# Patient Record
Sex: Female | Born: 1940 | Race: White | Hispanic: No | Smoking: Never smoker
Health system: Southern US, Community
[De-identification: ages and names within clinical notes are randomized; demographics above are authoritative.]

## PROBLEM LIST (undated history)

## (undated) ENCOUNTER — Emergency Department: Admission: EM | Payer: Medicare HMO

## (undated) DIAGNOSIS — H409 Unspecified glaucoma: Secondary | ICD-10-CM

## (undated) DIAGNOSIS — I6529 Occlusion and stenosis of unspecified carotid artery: Secondary | ICD-10-CM

## (undated) DIAGNOSIS — I10 Essential (primary) hypertension: Secondary | ICD-10-CM

## (undated) DIAGNOSIS — I2510 Atherosclerotic heart disease of native coronary artery without angina pectoris: Secondary | ICD-10-CM

## (undated) DIAGNOSIS — E785 Hyperlipidemia, unspecified: Secondary | ICD-10-CM

## (undated) DIAGNOSIS — C801 Malignant (primary) neoplasm, unspecified: Secondary | ICD-10-CM

## (undated) HISTORY — DX: Malignant (primary) neoplasm, unspecified: C80.1

## (undated) HISTORY — PX: EYE SURGERY: SHX253

## (undated) HISTORY — DX: Unspecified glaucoma: H40.9

## (undated) HISTORY — DX: Hyperlipidemia, unspecified: E78.5

## (undated) HISTORY — PX: CATARACT EXTRACTION, BILATERAL: SHX1313

## (undated) HISTORY — DX: Atherosclerotic heart disease of native coronary artery without angina pectoris: I25.10

## (undated) HISTORY — DX: Occlusion and stenosis of unspecified carotid artery: I65.29

## (undated) HISTORY — PX: SKIN CANCER EXCISION: SHX779

## (undated) HISTORY — DX: Essential (primary) hypertension: I10

---

## 2003-07-10 HISTORY — PX: CAROTID ENDARTERECTOMY: SUR193

## 2003-07-10 HISTORY — PX: CORONARY ARTERY BYPASS GRAFT: SHX141

## 2004-01-11 ENCOUNTER — Inpatient Hospital Stay (HOSPITAL_COMMUNITY): Admission: RE | Admit: 2004-01-11 | Discharge: 2004-01-15 | Payer: Self-pay | Admitting: Cardiothoracic Surgery

## 2004-03-02 ENCOUNTER — Encounter: Admission: RE | Admit: 2004-03-02 | Discharge: 2004-03-02 | Payer: Self-pay | Admitting: Cardiothoracic Surgery

## 2005-03-07 ENCOUNTER — Ambulatory Visit: Payer: Self-pay | Admitting: Ophthalmology

## 2005-04-11 ENCOUNTER — Ambulatory Visit: Payer: Self-pay | Admitting: Ophthalmology

## 2006-05-03 ENCOUNTER — Ambulatory Visit: Payer: Self-pay | Admitting: Unknown Physician Specialty

## 2007-12-26 ENCOUNTER — Ambulatory Visit: Payer: Self-pay | Admitting: Gastroenterology

## 2010-09-05 ENCOUNTER — Encounter (INDEPENDENT_AMBULATORY_CARE_PROVIDER_SITE_OTHER): Payer: BC Managed Care – PPO | Admitting: Vascular Surgery

## 2010-09-05 DIAGNOSIS — I6529 Occlusion and stenosis of unspecified carotid artery: Secondary | ICD-10-CM

## 2010-09-06 ENCOUNTER — Encounter: Payer: Self-pay | Admitting: Vascular Surgery

## 2010-09-06 NOTE — Consult Note (Signed)
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  She is very pleasant 70 year old white female who has undergone a prior her right carotid endarterectomy in conjunction with coronary artery bypass grafting at [facility] in 2005.  She has had no neurologic deficits.  She remains quite active, continues to work as a Midwife.  She has undergone carotid ultrasound at [facility] and I have the results for review, discussed with the patient.  She does not have any neurologic deficits.  MEDICAL HISTORY:  Positive for hypertension, elevated cholesterol.  SURGICAL HISTORY:  For coronary artery bypass grafting, carotid endarterectomy 2005.  SOCIAL HISTORY:  She is widowed with 2 children.  She does not smoke or drink alcohol.  FAMILY HISTORY:  Notable for premature arthrosclerotic disease.  REVIEW OF SYSTEMS:  Positive for weight loss.  She weighs 118 pounds, she is 5 feet 7 inches tall.  Review of systems is negative aside from HPI.  She is on aspirin therapy.  PHYSICAL EXAMINATION:  General:  Well-developed, thin white female appearing stated age of 70 in no acute distress.  Vital signs:  Blood pressure is 151/74 in her right arm, 161/73 in her left arm, heart rate 61, respirations 18.  HEENT:  Normal.  She has well-healed right carotid incision with no bruits bilaterally.  Chest:  Clear bilaterally.  Heart: Regular rate and rhythm.  Radial pulses are 2+.  She has 2+ dorsalis pedis pulses bilaterally.  Musculoskeletal:  Shows no major deformity or cyanosis.  Neurologic:  No focal weakness, paresthesias.  Skin:  Without ulcers or rashes.  I do have her carotid duplex from [facility] for review.  This shows no significant stenosis bilaterally from a velocity criteria.   The interpretation reports the appearance of stenosis in the visual portion of the duplex.  I had a long discussion with the patient and her family present.  I am quite pleased that she has not had any evidence of recurrent stenosis in her left carotid from her surgery 70 years ago.  She has no evidence of right carotid stenosis from a flow standpoint.  I explained that in our lab we do not rely on the visual images since this frequently over-calls any degree of stenosis.  I have recommended that we see her for continued followup in 6 months in our lab to rule out any progression of her stenosis.  She will notify us should she develop any difficulty in the interim.    Larina Earthly, M.D. Electronically Signed  TFE/MEDQ  D:  09/05/2010  T:  09/06/2010  Job:  [id number]  cc:   Dr. Gwen Pounds in [facility] Dr. Barbette Reichmann, [facility]

## 2011-09-03 ENCOUNTER — Encounter: Payer: Self-pay | Admitting: Vascular Surgery

## 2011-09-03 ENCOUNTER — Encounter: Payer: Self-pay | Admitting: *Deleted

## 2011-09-04 ENCOUNTER — Other Ambulatory Visit (INDEPENDENT_AMBULATORY_CARE_PROVIDER_SITE_OTHER): Payer: BC Managed Care – PPO | Admitting: *Deleted

## 2011-09-04 ENCOUNTER — Ambulatory Visit (INDEPENDENT_AMBULATORY_CARE_PROVIDER_SITE_OTHER): Payer: BC Managed Care – PPO | Admitting: Vascular Surgery

## 2011-09-04 ENCOUNTER — Encounter: Payer: Self-pay | Admitting: Vascular Surgery

## 2011-09-04 DIAGNOSIS — I6529 Occlusion and stenosis of unspecified carotid artery: Secondary | ICD-10-CM

## 2011-09-04 NOTE — Progress Notes (Signed)
The patient presents today for followup of extracranial cerebrovascular occlusive disease. He is status post right carotid endarterectomy with combined coronary bypass grafting in July 2005. She denies any new neurologic deficits is been stable from a cardiac standpoint.  Past Medical History  Diagnosis Date  . Hyperlipidemia   . Hypertension     History  Substance Use Topics  . Smoking status: Not on file  . Smokeless tobacco: Not on file  . Alcohol Use:     Family History  Problem Relation Age of Onset  . Other Other     premature atherosclerotic disease    Allergies  Allergen Reactions  . Penicillins     Current outpatient prescriptions:aspirin 325 MG EC tablet, Take 325 mg by mouth daily., Disp: , Rfl: ;  metoprolol tartrate (LOPRESSOR) 25 MG tablet, Take 25 mg by mouth daily., Disp: , Rfl: ;  Probiotic Product (ALIGN PO), Take by mouth., Disp: , Rfl: ;  rosuvastatin (CRESTOR) 5 MG tablet, Take 5 mg by mouth daily., Disp: , Rfl:   There were no vitals taken for this visit.  There is no height or weight on file to calculate BMI.       Physical exam: Well-developed thin white female in no acute distress Neurologic grossly intact with no deficits Carotid arteries without bruits bilaterally. Well-healed right neck incision 2+ radial pulses bilaterally  Vascular lab: No evidence of significant stenosis in the right or left carotid system. She does have an area in the prior endarterectomy site on the right with a in the common carotid artery appears to have area of mobile plaque. She has an unusual flow characteristics in this area in the past. There is no narrowing here.  Impression and plan: Status post right carotid endarterectomy in 2005. No evidence of stenosis. She does have a slight irregularity with a mobile plaque. I discussed this at length with the patient explained with no symptoms would not recommend anything other than continued yearly duplex followup. She will  notify should she develop any new neurologic deficits.

## 2011-09-11 ENCOUNTER — Other Ambulatory Visit: Payer: BC Managed Care – PPO

## 2011-09-11 ENCOUNTER — Ambulatory Visit: Payer: BC Managed Care – PPO | Admitting: Vascular Surgery

## 2011-09-20 NOTE — Procedures (Unsigned)
CAROTID DUPLEX EXAM  INDICATION:  Follow up carotid artery disease.  HISTORY: Diabetes:  No. Cardiac:  Coronary artery disease, CABG 01/2004. Hypertension:  Yes. Smoking:  No. Previous Surgery:  Right carotid endarterectomy with DPA 01/11/2004. CV History:  Asymptomatic. Amaurosis Fugax No, Paresthesias No, Hemiparesis No                                      RIGHT             LEFT Brachial systolic pressure:         220               200 Brachial Doppler waveforms:         Biphasic          Biphasic Vertebral direction of flow:        Antegrade         Antegrade DUPLEX VELOCITIES (cm/sec) CCA peak systolic                   75                85 ECA peak systolic                   83                170 ICA peak systolic                   140               98 (distal) ICA end diastolic                   32                32 PLAQUE MORPHOLOGY:                  Mixed             Mixed PLAQUE AMOUNT:                      Moderate          Mild PLAQUE LOCATION:                    Bifurcation, ICA, and CCA           Bifurcation and CCA.  CAROTID ENDARTERECTOMY SITES:  Right:  158/30 (flap area)    IMPRESSION: 1. Right:  Mobile intimal flap at proximal carotid endarterectomy site     with an increased velocity of 158/30 cm/s.  1 to 39% right internal     carotid artery stenosis; however, velocity may be due to increased     velocity at mobile flap site. 2. Left:  One to 39% left internal carotid artery stenosis. 3. Left external carotid artery stenosis. 4. Vertebral artery flow antegrade bilaterally.  ___________________________________________ Larina Earthly, M.D.  SS/MEDQ  D:  09/04/2011  T:  09/04/2011  Job:  [id number]

## 2012-08-26 ENCOUNTER — Other Ambulatory Visit: Payer: Self-pay | Admitting: *Deleted

## 2012-08-26 DIAGNOSIS — Z48812 Encounter for surgical aftercare following surgery on the circulatory system: Secondary | ICD-10-CM

## 2012-08-26 DIAGNOSIS — I6529 Occlusion and stenosis of unspecified carotid artery: Secondary | ICD-10-CM

## 2012-09-09 ENCOUNTER — Ambulatory Visit: Payer: BC Managed Care – PPO | Admitting: Vascular Surgery

## 2012-09-09 ENCOUNTER — Other Ambulatory Visit: Payer: BC Managed Care – PPO

## 2012-10-27 ENCOUNTER — Encounter: Payer: Self-pay | Admitting: Vascular Surgery

## 2012-10-28 ENCOUNTER — Ambulatory Visit (INDEPENDENT_AMBULATORY_CARE_PROVIDER_SITE_OTHER): Payer: BC Managed Care – PPO | Admitting: Vascular Surgery

## 2012-10-28 ENCOUNTER — Encounter: Payer: Self-pay | Admitting: Vascular Surgery

## 2012-10-28 ENCOUNTER — Other Ambulatory Visit (INDEPENDENT_AMBULATORY_CARE_PROVIDER_SITE_OTHER): Payer: BC Managed Care – PPO | Admitting: Vascular Surgery

## 2012-10-28 DIAGNOSIS — Z48812 Encounter for surgical aftercare following surgery on the circulatory system: Secondary | ICD-10-CM

## 2012-10-28 DIAGNOSIS — I6529 Occlusion and stenosis of unspecified carotid artery: Secondary | ICD-10-CM | POA: Insufficient documentation

## 2012-10-28 NOTE — Progress Notes (Signed)
The patient presents today for continued followup of extracranial cerebrovascular occlusive disease. She is status post right carotid endarterectomy in July 2005 at the same time his coronary bypass grafting. She was having amaurosis fugax preprocedure but has not had any focal deficits since her surgery 9 years ago. She is here today with her daughter who reports that she does have some fatigue and falls asleep easily. The patient reports that she is having some difficulty with sleep at night and is more somnolent during the day. She has had no cardiac difficulty.  Past Medical History  Diagnosis Date  . Hyperlipidemia   . Hypertension   . Cancer     basal and squamous cell skin  . Carotid artery occlusion     History  Substance Use Topics  . Smoking status: Never Smoker   . Smokeless tobacco: Never Used  . Alcohol Use: 1.2 oz/week    1 Glasses of wine, 1 Cans of beer per week    Family History  Problem Relation Age of Onset  . Other Other     premature atherosclerotic disease  . Hypertension Father     Allergies  Allergen Reactions  . Penicillins     Current outpatient prescriptions:aspirin 325 MG EC tablet, Take 325 mg by mouth daily., Disp: , Rfl: ;  Cetirizine HCl (ZYRTEC PO), Take 1 tablet by mouth as needed., Disp: , Rfl: ;  metoprolol tartrate (LOPRESSOR) 25 MG tablet, Take 25 mg by mouth daily., Disp: , Rfl: ;  Probiotic Product (ALIGN PO), Take by mouth., Disp: , Rfl: ;  rosuvastatin (CRESTOR) 5 MG tablet, Take 5 mg by mouth daily., Disp: , Rfl:  timolol (BETIMOL) 0.25 % ophthalmic solution, Place 1-2 drops into the right eye 2 (two) times daily., Disp: , Rfl:   BP 162/70  Pulse 56  Wt 115 lb 8 oz (52.39 kg)  SpO2 100%  There is no height on file to calculate BMI.       Physical exam: Well-developed well-nourished white female appearing stated age. She is grossly intact neurologically Carotid arteries without bruits bilaterally with a well-healed right neck  incision Heart regular rate and rhythm Chest clear with equal breath sounds bilaterally 2+ radial pulses bilaterally  Carotid duplex today reveals moderate narrowing in the endarterectomy site suggested of a 40-59% stenosis. There is no significant stenosis on the left side. On her study one year ago there was some question of potential mobile plaque in the right endarterectomy site in this is not seen today  Impression and plan: Stable status post right carotid endarterectomy in 2005. I again reviewed focal neurologic deficits with the patient. She'll notify us immediately should this occur. Otherwise we will see her again in one year with repeat carotid duplex

## 2012-10-28 NOTE — Addendum Note (Signed)
Addended by: Adria Dill L on: 10/28/2012 01:47 PM   Modules accepted: Orders

## 2012-11-19 DIAGNOSIS — C4402 Squamous cell carcinoma of skin of lip: Secondary | ICD-10-CM | POA: Insufficient documentation

## 2013-08-07 ENCOUNTER — Other Ambulatory Visit: Payer: Self-pay | Admitting: Vascular Surgery

## 2013-08-07 DIAGNOSIS — Z48812 Encounter for surgical aftercare following surgery on the circulatory system: Secondary | ICD-10-CM

## 2013-08-07 DIAGNOSIS — I6529 Occlusion and stenosis of unspecified carotid artery: Secondary | ICD-10-CM

## 2013-10-12 ENCOUNTER — Encounter: Payer: Self-pay | Admitting: Vascular Surgery

## 2013-10-13 ENCOUNTER — Ambulatory Visit (HOSPITAL_COMMUNITY)
Admission: RE | Admit: 2013-10-13 | Discharge: 2013-10-13 | Disposition: A | Discharge status: Home / Self Care | Payer: Medicare HMO | Source: Ambulatory Visit | Attending: Vascular Surgery | Admitting: Vascular Surgery

## 2013-10-13 ENCOUNTER — Encounter: Payer: Self-pay | Admitting: Vascular Surgery

## 2013-10-13 ENCOUNTER — Ambulatory Visit (INDEPENDENT_AMBULATORY_CARE_PROVIDER_SITE_OTHER): Payer: Medicare HMO | Admitting: Vascular Surgery

## 2013-10-13 VITALS — BP 177/77 | HR 57 | Resp 16 | Ht 66.5 in | Wt 116.5 lb

## 2013-10-13 DIAGNOSIS — Z48812 Encounter for surgical aftercare following surgery on the circulatory system: Secondary | ICD-10-CM

## 2013-10-13 DIAGNOSIS — I6529 Occlusion and stenosis of unspecified carotid artery: Secondary | ICD-10-CM

## 2013-10-13 NOTE — Progress Notes (Signed)
Patient presents today for continued followup of her prior right carotid endarterectomy in 2005 at the same setting of coronary bypass grafting. She had had visual changes prior to her endarterectomy. 4 [place]iley she's had no neurologic deficits and 10 years since her surgery. She is quite active. She suffered normally from allergies from mild the plans blooming. She denies any cardiac difficulty.  Past Medical History  Diagnosis Date  . Hyperlipidemia   . Hypertension   . Cancer     basal and squamous cell skin  . Carotid artery occlusion   . Glaucoma     History  Substance Use Topics  . Smoking status: Never Smoker   . Smokeless tobacco: Never Used  . Alcohol Use: 1.2 oz/week    1 Glasses of wine, 1 Cans of beer per week    Family History  Problem Relation Age of Onset  . Other Other     premature atherosclerotic disease  . Hypertension Father     Allergies  Allergen Reactions  . Penicillins     Current outpatient prescriptions:aspirin 325 MG EC tablet, Take 325 mg by mouth daily., Disp: , Rfl: ;  Cetirizine HCl (ZYRTEC PO), Take 1 tablet by mouth as needed., Disp: , Rfl: ;  metoprolol tartrate (LOPRESSOR) 25 MG tablet, Take 25 mg by mouth daily., Disp: , Rfl: ;  rosuvastatin (CRESTOR) 5 MG tablet, Take 5 mg by mouth daily., Disp: , Rfl:  timolol (BETIMOL) 0.25 % ophthalmic solution, Place 1-2 drops into the right eye 2 (two) times daily., Disp: , Rfl: ;  Probiotic Product (ALIGN PO), Take by mouth., Disp: , Rfl:   BP 177/77  Pulse 57  Resp 16  Ht 5' 6.5" (1.689 m)  Wt 116 lb 8 oz (52.844 kg)  BMI 18.52 kg/m2  Body mass index is 18.52 kg/(m^2).       Physical exam well-developed well-nourished white female no acute distress Carotid arteries without bruits bilaterally Heart regular rate and rhythm Radial pulses 2+ bilaterally Neurologically she is grossly intact  Vascular lab carotid duplex today was reviewed and discussed with the patient. This does show some  slightly elevated velocities in her left endarterectomy site tissue related to the patch itself. No evidence of critical stenosis and no evidence of her left carotid stenosis  Impression and plan: Stable status post right carotid endarterectomy 10 years ago. We'll continue yearly duplex followup in our office. She will notify should she develop any neurologic deficits

## 2013-10-14 NOTE — Addendum Note (Signed)
Addended by: Mena Goes on: 10/14/2013 09:35 AM   Modules accepted: Orders

## 2014-09-01 DIAGNOSIS — I25810 Atherosclerosis of coronary artery bypass graft(s) without angina pectoris: Secondary | ICD-10-CM | POA: Diagnosis not present

## 2014-09-01 DIAGNOSIS — E785 Hyperlipidemia, unspecified: Secondary | ICD-10-CM | POA: Diagnosis not present

## 2014-09-08 DIAGNOSIS — E559 Vitamin D deficiency, unspecified: Secondary | ICD-10-CM | POA: Diagnosis not present

## 2014-09-08 DIAGNOSIS — E780 Pure hypercholesterolemia: Secondary | ICD-10-CM | POA: Diagnosis not present

## 2014-09-08 DIAGNOSIS — I25812 Atherosclerosis of bypass graft of coronary artery of transplanted heart without angina pectoris: Secondary | ICD-10-CM | POA: Diagnosis not present

## 2014-09-23 DIAGNOSIS — H4011X1 Primary open-angle glaucoma, mild stage: Secondary | ICD-10-CM | POA: Diagnosis not present

## 2014-10-18 ENCOUNTER — Encounter: Payer: Self-pay | Admitting: Family

## 2014-10-19 ENCOUNTER — Encounter: Payer: Self-pay | Admitting: Family

## 2014-10-19 ENCOUNTER — Ambulatory Visit (INDEPENDENT_AMBULATORY_CARE_PROVIDER_SITE_OTHER): Payer: Commercial Managed Care - HMO | Admitting: Family

## 2014-10-19 ENCOUNTER — Ambulatory Visit (HOSPITAL_COMMUNITY)
Admission: RE | Admit: 2014-10-19 | Discharge: 2014-10-19 | Disposition: A | Discharge status: Home / Self Care | Payer: Commercial Managed Care - HMO | Source: Ambulatory Visit | Attending: Family | Admitting: Family

## 2014-10-19 VITALS — BP 132/58 | HR 62 | Temp 97.7°F | Resp 20 | Ht 66.5 in | Wt 117.8 lb

## 2014-10-19 DIAGNOSIS — Z48812 Encounter for surgical aftercare following surgery on the circulatory system: Secondary | ICD-10-CM

## 2014-10-19 DIAGNOSIS — I6523 Occlusion and stenosis of bilateral carotid arteries: Secondary | ICD-10-CM | POA: Insufficient documentation

## 2014-10-19 DIAGNOSIS — Z9889 Other specified postprocedural states: Secondary | ICD-10-CM

## 2014-10-19 DIAGNOSIS — I6529 Occlusion and stenosis of unspecified carotid artery: Secondary | ICD-10-CM | POA: Insufficient documentation

## 2014-10-19 NOTE — Patient Instructions (Signed)
Stroke Prevention Some medical conditions and behaviors are associated with an increased chance of having a stroke. You may prevent a stroke by making healthy choices and managing medical conditions. HOW CAN I REDUCE MY RISK OF HAVING A STROKE?   Stay physically active. Get at least 30 minutes of activity on most or all days.  Do not smoke. It may also be helpful to avoid exposure to secondhand smoke.  Limit alcohol use. Moderate alcohol use is considered to be:  No more than 2 drinks per day for men.  No more than 1 drink per day for nonpregnant women.  Eat healthy foods. This involves:  Eating 5 or more servings of fruits and vegetables a day.  Making dietary changes that address high blood pressure (hypertension), high cholesterol, diabetes, or obesity.  Manage your cholesterol levels.  Making food choices that are high in fiber and low in saturated fat, trans fat, and cholesterol may control cholesterol levels.  Take any prescribed medicines to control cholesterol as directed by your health care provider.  Manage your diabetes.  Controlling your carbohydrate and sugar intake is recommended to manage diabetes.  Take any prescribed medicines to control diabetes as directed by your health care provider.  Control your hypertension.  Making food choices that are low in salt (sodium), saturated fat, trans fat, and cholesterol is recommended to manage hypertension.  Take any prescribed medicines to control hypertension as directed by your health care provider.  Maintain a healthy weight.  Reducing calorie intake and making food choices that are low in sodium, saturated fat, trans fat, and cholesterol are recommended to manage weight.  Stop drug abuse.  Avoid taking birth control pills.  Talk to your health care provider about the risks of taking birth control pills if you are over 35 years old, smoke, get migraines, or have ever had a blood clot.  Get evaluated for sleep  disorders (sleep apnea).  Talk to your health care provider about getting a sleep evaluation if you snore a lot or have excessive sleepiness.  Take medicines only as directed by your health care provider.  For some people, aspirin or blood thinners (anticoagulants) are helpful in reducing the risk of forming abnormal blood clots that can lead to stroke. If you have the irregular heart rhythm of atrial fibrillation, you should be on a blood thinner unless there is a good reason you cannot take them.  Understand all your medicine instructions.  Make sure that other conditions (such as anemia or atherosclerosis) are addressed. SEEK IMMEDIATE MEDICAL CARE IF:   You have sudden weakness or numbness of the face, arm, or leg, especially on one side of the body.  Your face or eyelid droops to one side.  You have sudden confusion.  You have trouble speaking (aphasia) or understanding.  You have sudden trouble seeing in one or both eyes.  You have sudden trouble walking.  You have dizziness.  You have a loss of balance or coordination.  You have a sudden, severe headache with no known cause.  You have new chest pain or an irregular heartbeat. Any of these symptoms may represent a serious problem that is an emergency. Do not wait to see if the symptoms will go away. Get medical help at once. Call your local emergency services (911 in [place] Do not drive yourself to the hospital. Document Released: 08/02/2004 Document Revised: 11/09/2013 Document Reviewed: 12/26/2012 Exit[employer] Patient Information 2015 [employer]. This information is not intended to replace advice given   to you by your health care provider. Make sure you discuss any questions you have with your health care provider.  

## 2014-10-19 NOTE — Progress Notes (Signed)
Established Carotid Patient   History of Present Illness  Brenda Russell is a 74 y.o. female patient of Dr. Donnetta Hutching who is s/p right carotid endarterectomy in 2005 at the same setting of coronary bypass grafting. She had had visual changes prior to her endarterectomy, no further stroke or TIA activity. She returns today for follow up.  The patient denies any history of TIA or stroke symptoms, specifically the patient reports history of right amaurosis fugax or monocular blindness, denies a history unilateral  of facial drooping, denies a history of hemiplegia, and denies a history of receptive or expressive aphasia.    She denies claudication symptoms with walking.  The patient denies New Medical or Surgical History.  Pt Diabetic: No Pt smoker: non-smoker, but she was exposed to second had smoke from her parents and husband  Pt meds include: Statin : Yes ASA: Yes Other anticoagulants/antiplatelets: no   Past Medical History  Diagnosis Date  . Hyperlipidemia   . Hypertension   . Cancer     basal and squamous cell skin  . Carotid artery occlusion   . Glaucoma   . Coronary artery disease     Social History History  Substance Use Topics  . Smoking status: Never Smoker   . Smokeless tobacco: Never Used  . Alcohol Use: 1.2 oz/week    1 Glasses of wine, 1 Cans of beer per week    Family History Family History  Problem Relation Age of Onset  . Other Other     premature atherosclerotic disease  . Hypertension Father     Surgical History Past Surgical History  Procedure Laterality Date  . Eye surgery      cataract  . Coronary artery bypass graft  2005  . Carotid endarterectomy  2005  . Cataract extraction, bilateral      Allergies  Allergen Reactions  . Penicillins     Current Outpatient Prescriptions  Medication Sig Dispense Refill  . aspirin 325 MG EC tablet Take 325 mg by mouth daily.    . Cetirizine HCl (ZYRTEC PO) Take 1 tablet by mouth as needed.    .  metoprolol tartrate (LOPRESSOR) 25 MG tablet Take 25 mg by mouth daily.    . Probiotic Product (ALIGN PO) Take by mouth as needed.     . rosuvastatin (CRESTOR) 5 MG tablet Take 5 mg by mouth daily.    . timolol (BETIMOL) 0.25 % ophthalmic solution Place 1-2 drops into the right eye 2 (two) times daily.    . Vitamin D, Ergocalciferol, (DRISDOL) 50000 UNITS CAPS capsule Take 50,000 Units by mouth every 7 (seven) days.     No current facility-administered medications for this visit.    Review of Systems : See HPI for pertinent positives and negatives.  Physical Examination  Filed Vitals:   10/19/14 1321 10/19/14 1322  BP: 120/65 132/58  Pulse: 62   Temp: 97.7 F (36.5 C)   TempSrc: Oral   Resp: 20   Height: 5' 6.5" (1.689 m)   Weight: 117 lb 12.8 oz (53.434 kg)    Body mass index is 18.73 kg/(m^2).  General: WDWN female in NAD GAIT: normal Eyes: PERRLA Pulmonary:  Non-labored, CTAB, Negative  Rales, Negative rhonchi, & Negative wheezing.  Cardiac: regular Rhythm,  Negative detected murmur.  VASCULAR EXAM Carotid Bruits Right Left   Negative Negative    Aorta is not palpable. Radial pulses are 2+ palpable and equal.  LE Pulses Right Left       POPLITEAL  not palpable   not palpable       POSTERIOR TIBIAL  faintly palpable   faintly palpable        DORSALIS PEDIS      ANTERIOR TIBIAL not palpable   not palpable     Gastrointestinal: soft, nontender, BS WNL, no r/g,  negative palpated masses.  Musculoskeletal: Negative muscle atrophy/wasting. M/S 5/5 throughout, Extremities without ischemic changes.  Neurologic: A&O X 3; Appropriate Affect, Speech is normal CN 2-12 intact, Pain and light touch intact in extremities, Motor exam as listed above.   Non-Invasive Vascular Imaging CAROTID DUPLEX 10/19/2014   CEREBROVASCULAR DUPLEX EVALUATION     INDICATION: Carotid artery disease    PREVIOUS INTERVENTION(S): Right carotid endarterectomy 01/11/2004    DUPLEX EXAM: Carotid duplex    RIGHT  LEFT  Peak Systolic Velocities (cm/s) End Diastolic Velocities (cm/s) Plaque LOCATION Peak Systolic Velocities (cm/s) End Diastolic Velocities (cm/s) Plaque  89 13 - CCA PROXIMAL 113 24 -  83 16 - CCA MID 134 24 HT  86 18 HT CCA DISTAL 112 19 HT  164 16 - ECA 217 13 HT  110 26 HT ICA PROXIMAL 82 22 HT  109 30 - ICA MID 103 28 -  101 28 - ICA DISTAL 110 32 -    N/A ICA / CCA Ratio (PSV) .82  Antegrade Vertebral Flow Antegrade  854 Brachial Systolic Pressure (mmHg) 627  Triphasic Brachial Artery Waveforms Triphasic    Plaque Morphology:  HM = Homogeneous, HT = Heterogeneous, CP = Calcific Plaque, SP = Smooth Plaque, IP = Irregular Plaque     ADDITIONAL FINDINGS: Hyperplasia present at the proximal/mid endarterectomy site on the right    IMPRESSION: 1. Right internal carotid artery is patent with history of carotid endarterectomy, less than 40% internal carotid artery stenosis. 2. Less than 40% left internal carotid artery stenosis. 3. Left external carotid artery stenosis    Compared to the previous exam:  No significant change      Assessment: Brenda Russell is a 74 y.o. female who is s/p right carotid endarterectomy in 2005 at the same setting of coronary bypass grafting. She had had visual changes prior to her endarterectomy, no further stroke or TIA activity. Today's carotid Duplex suggests right internal carotid artery is patent with history of carotid endarterectomy, less than 40% internal carotid artery stenosis. Less than 40% left internal carotid artery stenosis. Left external carotid artery stenosis. No significant change from previous Duplex.   Plan: Follow-up in 1 year with Carotid Duplex.   I discussed in depth with the patient the nature of atherosclerosis, and emphasized the importance of maximal medical management  including strict control of blood pressure, blood glucose, and lipid levels, obtaining regular exercise, and continued cessation of smoking.  The patient is aware that without maximal medical management the underlying atherosclerotic disease process will progress, limiting the benefit of any interventions. The patient was given information about stroke prevention and what symptoms should prompt the patient to seek immediate medical care. Thank you for allowing [place] to participate in this patient's care.  Clemon Chambers, RN, MSN, FNP-C [facility] Office: [phone]  Clinic Physician: Early  10/19/2014 1:26 PM

## 2015-03-02 DIAGNOSIS — E559 Vitamin D deficiency, unspecified: Secondary | ICD-10-CM | POA: Diagnosis not present

## 2015-03-02 DIAGNOSIS — I25812 Atherosclerosis of bypass graft of coronary artery of transplanted heart without angina pectoris: Secondary | ICD-10-CM | POA: Diagnosis not present

## 2015-03-02 DIAGNOSIS — E780 Pure hypercholesterolemia: Secondary | ICD-10-CM | POA: Diagnosis not present

## 2015-03-16 DIAGNOSIS — Z0000 Encounter for general adult medical examination without abnormal findings: Secondary | ICD-10-CM | POA: Diagnosis not present

## 2015-03-16 DIAGNOSIS — I25810 Atherosclerosis of coronary artery bypass graft(s) without angina pectoris: Secondary | ICD-10-CM | POA: Diagnosis not present

## 2015-03-16 DIAGNOSIS — E780 Pure hypercholesterolemia: Secondary | ICD-10-CM | POA: Diagnosis not present

## 2015-03-16 DIAGNOSIS — C4499 Other specified malignant neoplasm of skin, unspecified: Secondary | ICD-10-CM | POA: Insufficient documentation

## 2015-03-16 DIAGNOSIS — H409 Unspecified glaucoma: Secondary | ICD-10-CM | POA: Insufficient documentation

## 2015-03-16 DIAGNOSIS — R7989 Other specified abnormal findings of blood chemistry: Secondary | ICD-10-CM | POA: Insufficient documentation

## 2015-03-24 DIAGNOSIS — H4011X1 Primary open-angle glaucoma, mild stage: Secondary | ICD-10-CM | POA: Diagnosis not present

## 2015-05-18 DIAGNOSIS — Z23 Encounter for immunization: Secondary | ICD-10-CM | POA: Diagnosis not present

## 2015-06-08 DIAGNOSIS — D485 Neoplasm of uncertain behavior of skin: Secondary | ICD-10-CM | POA: Diagnosis not present

## 2015-06-08 DIAGNOSIS — C44719 Basal cell carcinoma of skin of left lower limb, including hip: Secondary | ICD-10-CM | POA: Diagnosis not present

## 2015-06-08 DIAGNOSIS — Z85828 Personal history of other malignant neoplasm of skin: Secondary | ICD-10-CM | POA: Diagnosis not present

## 2015-06-08 DIAGNOSIS — D0439 Carcinoma in situ of skin of other parts of face: Secondary | ICD-10-CM | POA: Diagnosis not present

## 2015-06-08 DIAGNOSIS — D0471 Carcinoma in situ of skin of right lower limb, including hip: Secondary | ICD-10-CM | POA: Diagnosis not present

## 2015-07-29 DIAGNOSIS — D0439 Carcinoma in situ of skin of other parts of face: Secondary | ICD-10-CM | POA: Diagnosis not present

## 2015-08-05 DIAGNOSIS — C44719 Basal cell carcinoma of skin of left lower limb, including hip: Secondary | ICD-10-CM | POA: Diagnosis not present

## 2015-08-05 DIAGNOSIS — D0471 Carcinoma in situ of skin of right lower limb, including hip: Secondary | ICD-10-CM | POA: Diagnosis not present

## 2015-09-20 DIAGNOSIS — H401131 Primary open-angle glaucoma, bilateral, mild stage: Secondary | ICD-10-CM | POA: Diagnosis not present

## 2015-10-05 DIAGNOSIS — E559 Vitamin D deficiency, unspecified: Secondary | ICD-10-CM | POA: Diagnosis not present

## 2015-10-05 DIAGNOSIS — C4499 Other specified malignant neoplasm of skin, unspecified: Secondary | ICD-10-CM | POA: Diagnosis not present

## 2015-10-05 DIAGNOSIS — I25810 Atherosclerosis of coronary artery bypass graft(s) without angina pectoris: Secondary | ICD-10-CM | POA: Diagnosis not present

## 2015-10-05 DIAGNOSIS — I6521 Occlusion and stenosis of right carotid artery: Secondary | ICD-10-CM | POA: Diagnosis not present

## 2015-10-05 DIAGNOSIS — H409 Unspecified glaucoma: Secondary | ICD-10-CM | POA: Diagnosis not present

## 2015-10-05 DIAGNOSIS — E7800 Pure hypercholesterolemia, unspecified: Secondary | ICD-10-CM | POA: Diagnosis not present

## 2015-10-18 ENCOUNTER — Ambulatory Visit: Payer: Commercial Managed Care - HMO | Admitting: Family

## 2015-10-18 ENCOUNTER — Encounter (HOSPITAL_COMMUNITY): Payer: Commercial Managed Care - HMO

## 2015-10-19 DIAGNOSIS — C4499 Other specified malignant neoplasm of skin, unspecified: Secondary | ICD-10-CM | POA: Diagnosis not present

## 2015-10-19 DIAGNOSIS — I25810 Atherosclerosis of coronary artery bypass graft(s) without angina pectoris: Secondary | ICD-10-CM | POA: Diagnosis not present

## 2015-10-19 DIAGNOSIS — H409 Unspecified glaucoma: Secondary | ICD-10-CM | POA: Diagnosis not present

## 2015-10-19 DIAGNOSIS — I6521 Occlusion and stenosis of right carotid artery: Secondary | ICD-10-CM | POA: Diagnosis not present

## 2015-10-19 DIAGNOSIS — E559 Vitamin D deficiency, unspecified: Secondary | ICD-10-CM | POA: Diagnosis not present

## 2015-10-19 DIAGNOSIS — E7800 Pure hypercholesterolemia, unspecified: Secondary | ICD-10-CM | POA: Diagnosis not present

## 2015-11-23 ENCOUNTER — Encounter: Payer: Self-pay | Admitting: Family

## 2015-11-30 ENCOUNTER — Encounter: Payer: Self-pay | Admitting: Family

## 2015-11-30 ENCOUNTER — Ambulatory Visit (INDEPENDENT_AMBULATORY_CARE_PROVIDER_SITE_OTHER): Payer: Commercial Managed Care - HMO | Admitting: Family

## 2015-11-30 ENCOUNTER — Ambulatory Visit (HOSPITAL_COMMUNITY)
Admission: RE | Admit: 2015-11-30 | Discharge: 2015-11-30 | Disposition: A | Discharge status: Home / Self Care | Payer: Commercial Managed Care - HMO | Source: Ambulatory Visit | Attending: Family | Admitting: Family

## 2015-11-30 VITALS — BP 146/72 | HR 55 | Temp 97.7°F | Resp 16 | Ht 66.5 in | Wt 117.0 lb

## 2015-11-30 DIAGNOSIS — I6521 Occlusion and stenosis of right carotid artery: Secondary | ICD-10-CM

## 2015-11-30 DIAGNOSIS — I6523 Occlusion and stenosis of bilateral carotid arteries: Secondary | ICD-10-CM | POA: Diagnosis not present

## 2015-11-30 DIAGNOSIS — I2510 Atherosclerotic heart disease of native coronary artery without angina pectoris: Secondary | ICD-10-CM | POA: Insufficient documentation

## 2015-11-30 DIAGNOSIS — E785 Hyperlipidemia, unspecified: Secondary | ICD-10-CM | POA: Diagnosis not present

## 2015-11-30 DIAGNOSIS — Z48812 Encounter for surgical aftercare following surgery on the circulatory system: Secondary | ICD-10-CM | POA: Insufficient documentation

## 2015-11-30 DIAGNOSIS — Z9889 Other specified postprocedural states: Secondary | ICD-10-CM | POA: Diagnosis not present

## 2015-11-30 DIAGNOSIS — I10 Essential (primary) hypertension: Secondary | ICD-10-CM | POA: Insufficient documentation

## 2015-11-30 NOTE — Patient Instructions (Signed)
Stroke Prevention Some medical conditions and behaviors are associated with an increased chance of having a stroke. You may prevent a stroke by making healthy choices and managing medical conditions. HOW CAN I REDUCE MY RISK OF HAVING A STROKE?   Stay physically active. Get at least 30 minutes of activity on most or all days.  Do not smoke. It may also be helpful to avoid exposure to secondhand smoke.  Limit alcohol use. Moderate alcohol use is considered to be:  No more than 2 drinks per day for men.  No more than 1 drink per day for nonpregnant women.  Eat healthy foods. This involves:  Eating 5 or more servings of fruits and vegetables a day.  Making dietary changes that address high blood pressure (hypertension), high cholesterol, diabetes, or obesity.  Manage your cholesterol levels.  Making food choices that are high in fiber and low in saturated fat, trans fat, and cholesterol may control cholesterol levels.  Take any prescribed medicines to control cholesterol as directed by your health care provider.  Manage your diabetes.  Controlling your carbohydrate and sugar intake is recommended to manage diabetes.  Take any prescribed medicines to control diabetes as directed by your health care provider.  Control your hypertension.  Making food choices that are low in salt (sodium), saturated fat, trans fat, and cholesterol is recommended to manage hypertension.  Ask your health care provider if you need treatment to lower your blood pressure. Take any prescribed medicines to control hypertension as directed by your health care provider.  If you are 18-39 years of age, have your blood pressure checked every 3-5 years. If you are 40 years of age or older, have your blood pressure checked every year.  Maintain a healthy weight.  Reducing calorie intake and making food choices that are low in sodium, saturated fat, trans fat, and cholesterol are recommended to manage  weight.  Stop drug abuse.  Avoid taking birth control pills.  Talk to your health care provider about the risks of taking birth control pills if you are over 35 years old, smoke, get migraines, or have ever had a blood clot.  Get evaluated for sleep disorders (sleep apnea).  Talk to your health care provider about getting a sleep evaluation if you snore a lot or have excessive sleepiness.  Take medicines only as directed by your health care provider.  For some people, aspirin or blood thinners (anticoagulants) are helpful in reducing the risk of forming abnormal blood clots that can lead to stroke. If you have the irregular heart rhythm of atrial fibrillation, you should be on a blood thinner unless there is a good reason you cannot take them.  Understand all your medicine instructions.  Make sure that other conditions (such as anemia or atherosclerosis) are addressed. SEEK IMMEDIATE MEDICAL CARE IF:   You have sudden weakness or numbness of the face, arm, or leg, especially on one side of the body.  Your face or eyelid droops to one side.  You have sudden confusion.  You have trouble speaking (aphasia) or understanding.  You have sudden trouble seeing in one or both eyes.  You have sudden trouble walking.  You have dizziness.  You have a loss of balance or coordination.  You have a sudden, severe headache with no known cause.  You have new chest pain or an irregular heartbeat. Any of these symptoms may represent a serious problem that is an emergency. Do not wait to see if the symptoms will   go away. Get medical help at once. Call your local emergency services (911 in [place] Do not drive yourself to the hospital.   This information is not intended to replace advice given to you by your health care provider. Make sure you discuss any questions you have with your health care provider.   Document Released: 08/02/2004 Document Revised: 07/16/2014 Document Reviewed:  12/26/2012 Elsevier Interactive Patient Education 2016 Elsevier Inc.  

## 2015-11-30 NOTE — Progress Notes (Signed)
Chief Complaint: Follow up Extracranial Carotid Artery Stenosis   History of Present Illness  Brenda Russell is a 75 y.o. female patient of Dr. Donnetta Hutching who is s/p right carotid endarterectomy in 2005 at the same setting of coronary bypass grafting. She had right amaurosis fugax prior to her endarterectomy, no further stroke or TIA activity. She returns today for follow up.  She denies claudication symptoms with walking.  Pt states she intentionally did not take her amlodipine the previous 3 days but she did take it today; states with the addition of amlodipine she experiences vertigo if she turns her head. I advised pt to notify her PCP of this as soon as possible. He blood pressure is elevated now.   Pt Diabetic: No Pt smoker: non-smoker, but she was exposed to second had smoke from her parents and husband  Pt meds include: Statin : Yes ASA: Yes Other anticoagulants/antiplatelets: no   Past Medical History  Diagnosis Date  . Hyperlipidemia   . Hypertension   . Cancer ([place])     basal and squamous cell skin  . Carotid artery occlusion   . Glaucoma   . Coronary artery disease     Social History Social History  Substance Use Topics  . Smoking status: Never Smoker   . Smokeless tobacco: Never Used  . Alcohol Use: 1.2 oz/week    1 Glasses of wine, 1 Cans of beer per week    Family History Family History  Problem Relation Age of Onset  . Other Other     premature atherosclerotic disease  . Hypertension Father   . Heart disease Father   . Heart disease Mother   . Hypertension Mother   . Arthritis Sister     Surgical History Past Surgical History  Procedure Laterality Date  . Eye surgery      cataract  . Coronary artery bypass graft  2005  . Carotid endarterectomy  2005  . Cataract extraction, bilateral      Allergies  Allergen Reactions  . Penicillins Rash    Other reaction(s): Unknown    Current Outpatient Prescriptions  Medication Sig Dispense Refill   . aspirin 325 MG EC tablet Take 325 mg by mouth daily.    . Cetirizine HCl (ZYRTEC PO) Take 1 tablet by mouth as needed.    . metoprolol tartrate (LOPRESSOR) 25 MG tablet Take 25 mg by mouth daily.    . Probiotic Product (ALIGN PO) Take by mouth as needed.     . rosuvastatin (CRESTOR) 5 MG tablet Take 5 mg by mouth daily.    . timolol (BETIMOL) 0.25 % ophthalmic solution Place 1-2 drops into the right eye 2 (two) times daily.    . Vitamin D, Ergocalciferol, (DRISDOL) 50000 UNITS CAPS capsule Take 50,000 Units by mouth every 7 (seven) days.     No current facility-administered medications for this visit.    Review of Systems : See HPI for pertinent positives and negatives.  Physical Examination  Filed Vitals:   11/30/15 1540  BP: 177/70  Pulse: 55  Temp: 97.7 F (36.5 C)  Resp: 16  Height: 5' 6.5" (1.689 m)  Weight: 117 lb (53.071 kg)  SpO2: 99%   Body mass index is 18.6 kg/(m^2).  General: WDWN thin female in NAD GAIT: normal Eyes: PERRLA Pulmonary: Non-labored, CTAB Cardiac: regular rhythm,no detected murmur.  VASCULAR EXAM Carotid Bruits Right Left   Negative Negative   Aorta is not palpable. Radial pulses are 2+ palpable and equal.  LE Pulses Right Left   POPLITEAL not palpable  not palpable   POSTERIOR TIBIAL faintly palpable  faintly palpable    DORSALIS PEDIS  ANTERIOR TIBIAL not palpable  not palpable     Gastrointestinal: soft, nontender, BS WNL, no r/g, no palpated masses.  Musculoskeletal: No muscle atrophy/wasting. M/S 5/5 throughout, Extremities without ischemic changes.  Neurologic: A&O X 3; Appropriate Affect, Speech is normal CN 2-12 intact, Pain and light touch intact in extremities, Motor exam as listed above.         Non-Invasive Vascular Imaging CAROTID  DUPLEX 11/30/2015   CEREBROVASCULAR DUPLEX EVALUATION    INDICATION: Carotid artery disease    PREVIOUS INTERVENTION(S): Right carotid endarterectomy 01/11/2004    DUPLEX EXAM: Carotid duplex    RIGHT  LEFT  Peak Systolic Velocities (cm/s) End Diastolic Velocities (cm/s) Plaque LOCATION Peak Systolic Velocities (cm/s) End Diastolic Velocities (cm/s) Plaque  89 14  CCA PROXIMAL 98 / 178 17 / 33 HT  85 16  CCA MID 116 18 HT  142 19 HM CCA DISTAL 111 13 HT  117 4  ECA 197 16 HT  105 20 HT ICA PROXIMAL 73 18 HT  79 21  ICA MID 89 26 -  104 29  ICA DISTAL 100 27 -    N/A ICA / CCA Ratio (PSV) N/A  Antegrade Vertebral Flow Antegrade  - Brachial Systolic Pressure (mmHg) -  Triphasic Brachial Artery Waveforms Triphasic    Plaque Morphology:  HM = Homogeneous, HT = Heterogeneous, CP = Calcific Plaque, SP = Smooth Plaque, IP = Irregular Plaque     ADDITIONAL FINDINGS: Multiphasic subclavian arteries    IMPRESSION: 1. Patent right carotid endarterectomy site (with less than 40% proximal internal carotid artery stenosis), however soft, weblike thrombus / plaque with what appears to be two channels for flow is noted in the right bifurcation with elevated velocity 2. Less than 40% left internal carotid artery stenosis 3. Plaque noted in the left proximal to mid common carotid artery with increased velocity noted    Compared to the previous exam:  Change in velocity in the proximal right bifurcation since prior exam 10/19/14      Assessment: Brenda Russell is a 75 y.o. female who is s/p right carotid endarterectomy in 2005 at the same setting of coronary bypass grafting. She had right amaurosis fugax prior to her endarterectomy, no further stroke or TIA activity.   Pt states she intentionally did not take her amlodipine the previous 3 days but she did take it today; states with the addition of amlodipine she experiences vertigo if she turns her head. I advised pt to notify her PCP of this as  soon as possible. He blood pressure is elevated now.   Dr. Scot Dock discussed with ultrasonographer right carotid endarterectomy site (with less than 40% proximal internal carotid artery stenosis), however soft, weblike thrombus / plaque with what appears to be two channels for flow is noted in the right bifurcation with elevated velocity in the right ICA, does not appear mobile, but will have pt return in 6 months and see Dr. Donnetta Hutching; he can look at the live images if needed.   Plan: Follow-up in 6 months with Carotid Duplex scan and see Dr. Donnetta Hutching.    I discussed in depth with the patient the nature of atherosclerosis, and emphasized the importance of maximal medical management including strict control of blood pressure, blood glucose, and lipid levels, obtaining regular exercise, and continued cessation of smoking.  The patient is aware that without maximal medical management the underlying atherosclerotic disease process will progress, limiting the benefit of any interventions. The patient was given information about stroke prevention and what symptoms should prompt the patient to seek immediate medical care. Thank you for allowing [place] to participate in this patient's care.  Clemon Chambers, RN, MSN, FNP-C [facility] [place] Office: [phone]  Clinic Physician: Scot Dock  11/30/2015 3:44 PM

## 2015-11-30 NOTE — Progress Notes (Signed)
Filed Vitals:   11/30/15 1540 11/30/15 1544 11/30/15 1545  BP: 177/70 158/69 146/72  Pulse: 55 55 55  Temp: 97.7 F (36.5 C)    Resp: 16    Height: 5' 6.5" (1.689 m)    Weight: 117 lb (53.071 kg)    SpO2: 99%

## 2016-01-19 DIAGNOSIS — L570 Actinic keratosis: Secondary | ICD-10-CM | POA: Diagnosis not present

## 2016-01-19 DIAGNOSIS — D485 Neoplasm of uncertain behavior of skin: Secondary | ICD-10-CM | POA: Diagnosis not present

## 2016-01-19 DIAGNOSIS — Z85828 Personal history of other malignant neoplasm of skin: Secondary | ICD-10-CM | POA: Diagnosis not present

## 2016-01-19 DIAGNOSIS — C44519 Basal cell carcinoma of skin of other part of trunk: Secondary | ICD-10-CM | POA: Diagnosis not present

## 2016-01-19 DIAGNOSIS — X32XXXA Exposure to sunlight, initial encounter: Secondary | ICD-10-CM | POA: Diagnosis not present

## 2016-02-03 NOTE — Addendum Note (Signed)
Addended by: Thresa Ross C on: 02/03/2016 11:44 AM   Modules accepted: Orders

## 2016-03-07 ENCOUNTER — Emergency Department
Admission: EM | Admit: 2016-03-07 | Discharge: 2016-03-07 | Disposition: A | Discharge status: Other Acute Inpatient Hospital | Payer: Commercial Managed Care - HMO

## 2016-03-07 DIAGNOSIS — S60031A Contusion of right middle finger without damage to nail, initial encounter: Secondary | ICD-10-CM | POA: Diagnosis not present

## 2016-03-07 NOTE — ED Notes (Signed)
No answer when called from lobby and outside 

## 2016-03-07 NOTE — ED Notes (Signed)
No answer when called several times from lobby and outside ?

## 2016-03-26 DIAGNOSIS — H401131 Primary open-angle glaucoma, bilateral, mild stage: Secondary | ICD-10-CM | POA: Diagnosis not present

## 2016-04-02 DIAGNOSIS — H401131 Primary open-angle glaucoma, bilateral, mild stage: Secondary | ICD-10-CM | POA: Diagnosis not present

## 2016-04-06 DIAGNOSIS — E559 Vitamin D deficiency, unspecified: Secondary | ICD-10-CM | POA: Diagnosis not present

## 2016-04-06 DIAGNOSIS — I10 Essential (primary) hypertension: Secondary | ICD-10-CM | POA: Diagnosis not present

## 2016-04-06 DIAGNOSIS — I25810 Atherosclerosis of coronary artery bypass graft(s) without angina pectoris: Secondary | ICD-10-CM | POA: Diagnosis not present

## 2016-04-06 DIAGNOSIS — Z0000 Encounter for general adult medical examination without abnormal findings: Secondary | ICD-10-CM | POA: Diagnosis not present

## 2016-04-06 DIAGNOSIS — Z23 Encounter for immunization: Secondary | ICD-10-CM | POA: Diagnosis not present

## 2016-04-12 DIAGNOSIS — X32XXXA Exposure to sunlight, initial encounter: Secondary | ICD-10-CM | POA: Diagnosis not present

## 2016-04-12 DIAGNOSIS — C44519 Basal cell carcinoma of skin of other part of trunk: Secondary | ICD-10-CM | POA: Diagnosis not present

## 2016-04-12 DIAGNOSIS — L570 Actinic keratosis: Secondary | ICD-10-CM | POA: Diagnosis not present

## 2016-05-23 ENCOUNTER — Encounter: Payer: Self-pay | Admitting: Vascular Surgery

## 2016-05-29 ENCOUNTER — Ambulatory Visit (HOSPITAL_COMMUNITY)
Admission: RE | Admit: 2016-05-29 | Discharge: 2016-05-29 | Disposition: A | Discharge status: Home / Self Care | Payer: Commercial Managed Care - HMO | Source: Ambulatory Visit | Attending: Vascular Surgery | Admitting: Vascular Surgery

## 2016-05-29 ENCOUNTER — Ambulatory Visit (INDEPENDENT_AMBULATORY_CARE_PROVIDER_SITE_OTHER): Payer: Commercial Managed Care - HMO | Admitting: Vascular Surgery

## 2016-05-29 ENCOUNTER — Encounter: Payer: Self-pay | Admitting: Vascular Surgery

## 2016-05-29 VITALS — BP 190/71 | HR 53 | Temp 97.0°F | Resp 18 | Ht 66.0 in | Wt 112.0 lb

## 2016-05-29 DIAGNOSIS — I6521 Occlusion and stenosis of right carotid artery: Secondary | ICD-10-CM

## 2016-05-29 DIAGNOSIS — I6523 Occlusion and stenosis of bilateral carotid arteries: Secondary | ICD-10-CM | POA: Diagnosis not present

## 2016-05-29 DIAGNOSIS — Z9889 Other specified postprocedural states: Secondary | ICD-10-CM

## 2016-05-29 DIAGNOSIS — Z48812 Encounter for surgical aftercare following surgery on the circulatory system: Secondary | ICD-10-CM

## 2016-05-29 LAB — VAS US CAROTID
LCCADDIAS: 15 cm/s
LCCADSYS: 87 cm/s
LCCAPDIAS: 12 cm/s
LCCAPSYS: 82 cm/s
LEFT ECA DIAS: -14 cm/s
LEFT VERTEBRAL DIAS: -16 cm/s
LICAPSYS: -82 cm/s
Left ICA prox dias: -18 cm/s
RCCADSYS: -104 cm/s
RCCAPSYS: 66 cm/s
RIGHT CCA MID DIAS: 10 cm/s
RIGHT ECA DIAS: -1 cm/s
RIGHT VERTEBRAL DIAS: -11 cm/s
Right CCA prox dias: 11 cm/s

## 2016-05-29 NOTE — Progress Notes (Signed)
[facility] [place]  Patient name: Brenda Russell MRN: [id number] DOB: 07-Sep-1940 Sex: female  REASON FOR VISIT: Follow-up carotid disease  HPI: Brenda Russell is a 75 y.o. female today for follow-up. She underwent right carotid endarterectomy at the same time as a coronary artery bypass grafting in 2005. She has remained extremely active with no major medical difficulties. She has had no amaurosis fugax, transient ischemic attack or stroke. On her visit 6 months ago she was found to have irregularity with a weblike stenosis that was not causing any flow limitation in her endarterectomy site. She is seen today for follow-up of this. She continues to be completely asymptomatic  Past Medical History:  Diagnosis Date  . Cancer ([place])    basal and squamous cell skin  . Carotid artery occlusion   . Coronary artery disease   . Glaucoma   . Hyperlipidemia   . Hypertension     Family History  Problem Relation Age of Onset  . Other Other     premature atherosclerotic disease  . Hypertension Father   . Heart disease Father   . Heart disease Mother   . Hypertension Mother   . Arthritis Sister     SOCIAL HISTORY: Social History  Substance Use Topics  . Smoking status: Never Smoker  . Smokeless tobacco: Never Used  . Alcohol use 1.2 oz/week    1 Glasses of wine, 1 Cans of beer per week    Allergies  Allergen Reactions  . Penicillins Rash    Other reaction(s): Unknown    Current Outpatient Prescriptions  Medication Sig Dispense Refill  . amLODipine (NORVASC) 2.5 MG tablet Take 2.5 mg by mouth daily.    Marland Kitchen aspirin 325 MG EC tablet Take 325 mg by mouth daily.    . Cetirizine HCl (ZYRTEC PO) Take 1 tablet by mouth as needed. Reported on 11/30/2015    . metoprolol tartrate (LOPRESSOR) 25 MG tablet Take 25 mg by mouth daily. Reported on 11/30/2015    . rosuvastatin (CRESTOR) 5 MG tablet Take 5 mg by mouth daily.    . timolol  (BETIMOL) 0.25 % ophthalmic solution Place 1-2 drops into the right eye 2 (two) times daily.    . Vitamin D, Ergocalciferol, (DRISDOL) 50000 UNITS CAPS capsule Take 50,000 Units by mouth every 7 (seven) days.    . Probiotic Product (ALIGN PO) Take by mouth as needed.      No current facility-administered medications for this visit.     REVIEW OF SYSTEMS:  [X]  denotes positive finding, [ ]  denotes negative finding Cardiac  Comments:  Chest pain or chest pressure:    Shortness of breath upon exertion:    Short of breath when lying flat:    Irregular heart rhythm:        Vascular    Pain in calf, thigh, or hip brought on by ambulation:    Pain in feet at night that wakes you up from your sleep:     Blood clot in your veins:    Leg swelling:           PHYSICAL EXAM: Vitals:   05/29/16 1148 05/29/16 1151 05/29/16 1153 05/29/16 1154  BP: (!) 165/81 (!) 150/80 (!) 191/92 (!) 190/71  Pulse: (!) 53     Resp: 18     Temp: 97 F (36.1 C)     TempSrc: Oral     SpO2: 100%     Weight: 112 lb (50.8  kg)     Height: 5\' 6"  (1.676 m)       GENERAL: The patient is a well-nourished female, in no acute distress. The vital signs are documented above. CARDIOVASCULAR: 2+ radial pulses bilaterally. Right well-healed right carotid incision with no bruits bilaterally PULMONARY: There is good air exchange  MUSCULOSKELETAL: There are no major deformities or cyanosis. NEUROLOGIC: No focal weakness or paresthesias are detected. SKIN: There are no ulcers or rashes noted. PSYCHIATRIC: The patient has a normal affect.  DATA:  Duplex today shows no change from study 6 months ago. He has no evidence of any flow limitation throughout her right carotid system or left carotid system. There is an irregular potential would have been in the endarterectomy site which is not causing any flow limitation  MEDICAL ISSUES: I discussed this at great length with the patient. I explained that this is result of her old  endarterectomy with some irregular scarring in the bulb. Explained that she does not have symptoms certainly would be no indication to address this further with either further imaging or treatment. Would recommend seeing her at yearly intervals to assure no change. Again reviewed symptoms of right-sided carotid disease and she knows to report immediately to the emergency room should this occur    Rosetta Posner, MD [facility] Tel [phone] Pager [phone]

## 2016-05-29 NOTE — Progress Notes (Signed)
Vitals:   05/29/16 1148 05/29/16 1151 05/29/16 1153 05/29/16 1154  BP: (!) 165/81 (!) 150/80 (!) 191/92 (!) 190/71  Pulse: (!) 53     Resp: 18     Temp: 97 F (36.1 C)     TempSrc: Oral     SpO2: 100%     Weight: 112 lb (50.8 kg)     Height: 5\' 6"  (1.676 m)

## 2016-05-30 NOTE — Addendum Note (Signed)
Addended by: Lianne Cure A on: 05/30/2016 02:12 PM   Modules accepted: Orders

## 2016-08-15 DIAGNOSIS — D485 Neoplasm of uncertain behavior of skin: Secondary | ICD-10-CM | POA: Diagnosis not present

## 2016-08-15 DIAGNOSIS — C44319 Basal cell carcinoma of skin of other parts of face: Secondary | ICD-10-CM | POA: Diagnosis not present

## 2016-08-15 DIAGNOSIS — X32XXXA Exposure to sunlight, initial encounter: Secondary | ICD-10-CM | POA: Diagnosis not present

## 2016-08-15 DIAGNOSIS — Z85828 Personal history of other malignant neoplasm of skin: Secondary | ICD-10-CM | POA: Diagnosis not present

## 2016-08-15 DIAGNOSIS — Z08 Encounter for follow-up examination after completed treatment for malignant neoplasm: Secondary | ICD-10-CM | POA: Diagnosis not present

## 2016-08-15 DIAGNOSIS — L570 Actinic keratosis: Secondary | ICD-10-CM | POA: Diagnosis not present

## 2016-08-15 DIAGNOSIS — Z872 Personal history of diseases of the skin and subcutaneous tissue: Secondary | ICD-10-CM | POA: Diagnosis not present

## 2016-10-02 DIAGNOSIS — I25810 Atherosclerosis of coronary artery bypass graft(s) without angina pectoris: Secondary | ICD-10-CM | POA: Diagnosis not present

## 2016-10-02 DIAGNOSIS — R739 Hyperglycemia, unspecified: Secondary | ICD-10-CM | POA: Diagnosis not present

## 2016-10-02 DIAGNOSIS — Z0000 Encounter for general adult medical examination without abnormal findings: Secondary | ICD-10-CM | POA: Diagnosis not present

## 2016-10-02 DIAGNOSIS — H409 Unspecified glaucoma: Secondary | ICD-10-CM | POA: Diagnosis not present

## 2016-10-02 DIAGNOSIS — I10 Essential (primary) hypertension: Secondary | ICD-10-CM | POA: Diagnosis not present

## 2016-10-02 DIAGNOSIS — E7800 Pure hypercholesterolemia, unspecified: Secondary | ICD-10-CM | POA: Diagnosis not present

## 2016-11-06 DIAGNOSIS — C44319 Basal cell carcinoma of skin of other parts of face: Secondary | ICD-10-CM | POA: Diagnosis not present

## 2017-01-08 DIAGNOSIS — C44319 Basal cell carcinoma of skin of other parts of face: Secondary | ICD-10-CM | POA: Diagnosis not present

## 2017-04-04 DIAGNOSIS — I10 Essential (primary) hypertension: Secondary | ICD-10-CM | POA: Diagnosis not present

## 2017-04-04 DIAGNOSIS — E7800 Pure hypercholesterolemia, unspecified: Secondary | ICD-10-CM | POA: Diagnosis not present

## 2017-04-04 DIAGNOSIS — I25810 Atherosclerosis of coronary artery bypass graft(s) without angina pectoris: Secondary | ICD-10-CM | POA: Diagnosis not present

## 2017-04-04 DIAGNOSIS — R739 Hyperglycemia, unspecified: Secondary | ICD-10-CM | POA: Diagnosis not present

## 2017-04-04 DIAGNOSIS — H409 Unspecified glaucoma: Secondary | ICD-10-CM | POA: Diagnosis not present

## 2017-04-10 DIAGNOSIS — H409 Unspecified glaucoma: Secondary | ICD-10-CM | POA: Diagnosis not present

## 2017-04-10 DIAGNOSIS — Z0000 Encounter for general adult medical examination without abnormal findings: Secondary | ICD-10-CM | POA: Diagnosis not present

## 2017-04-10 DIAGNOSIS — E7800 Pure hypercholesterolemia, unspecified: Secondary | ICD-10-CM | POA: Diagnosis not present

## 2017-04-10 DIAGNOSIS — I25810 Atherosclerosis of coronary artery bypass graft(s) without angina pectoris: Secondary | ICD-10-CM | POA: Diagnosis not present

## 2017-04-10 DIAGNOSIS — I10 Essential (primary) hypertension: Secondary | ICD-10-CM | POA: Diagnosis not present

## 2017-04-10 DIAGNOSIS — C4499 Other specified malignant neoplasm of skin, unspecified: Secondary | ICD-10-CM | POA: Diagnosis not present

## 2017-05-15 DIAGNOSIS — C44629 Squamous cell carcinoma of skin of left upper limb, including shoulder: Secondary | ICD-10-CM | POA: Diagnosis not present

## 2017-05-15 DIAGNOSIS — Z85828 Personal history of other malignant neoplasm of skin: Secondary | ICD-10-CM | POA: Diagnosis not present

## 2017-05-15 DIAGNOSIS — Z08 Encounter for follow-up examination after completed treatment for malignant neoplasm: Secondary | ICD-10-CM | POA: Diagnosis not present

## 2017-05-15 DIAGNOSIS — D485 Neoplasm of uncertain behavior of skin: Secondary | ICD-10-CM | POA: Diagnosis not present

## 2017-05-15 DIAGNOSIS — Z872 Personal history of diseases of the skin and subcutaneous tissue: Secondary | ICD-10-CM | POA: Diagnosis not present

## 2017-05-15 DIAGNOSIS — L570 Actinic keratosis: Secondary | ICD-10-CM | POA: Diagnosis not present

## 2017-05-15 DIAGNOSIS — X32XXXA Exposure to sunlight, initial encounter: Secondary | ICD-10-CM | POA: Diagnosis not present

## 2017-06-04 ENCOUNTER — Ambulatory Visit: Payer: Self-pay | Admitting: Vascular Surgery

## 2017-06-04 ENCOUNTER — Encounter (HOSPITAL_COMMUNITY): Payer: Self-pay

## 2017-07-03 DIAGNOSIS — C44629 Squamous cell carcinoma of skin of left upper limb, including shoulder: Secondary | ICD-10-CM | POA: Diagnosis not present

## 2017-07-30 ENCOUNTER — Ambulatory Visit: Payer: Self-pay | Admitting: Vascular Surgery

## 2017-07-30 ENCOUNTER — Encounter (HOSPITAL_COMMUNITY): Payer: Self-pay

## 2017-08-06 ENCOUNTER — Encounter: Payer: Self-pay | Admitting: Vascular Surgery

## 2017-08-06 ENCOUNTER — Ambulatory Visit: Payer: Medicare HMO | Admitting: Vascular Surgery

## 2017-08-06 ENCOUNTER — Ambulatory Visit (HOSPITAL_COMMUNITY)
Admission: RE | Admit: 2017-08-06 | Discharge: 2017-08-06 | Disposition: A | Discharge status: Home / Self Care | Payer: Medicare HMO | Source: Ambulatory Visit | Attending: Vascular Surgery | Admitting: Vascular Surgery

## 2017-08-06 ENCOUNTER — Other Ambulatory Visit: Payer: Self-pay

## 2017-08-06 VITALS — BP 134/78 | HR 69 | Temp 97.8°F | Resp 16 | Ht 66.0 in | Wt 115.0 lb

## 2017-08-06 DIAGNOSIS — E785 Hyperlipidemia, unspecified: Secondary | ICD-10-CM | POA: Diagnosis not present

## 2017-08-06 DIAGNOSIS — Z9889 Other specified postprocedural states: Secondary | ICD-10-CM | POA: Diagnosis not present

## 2017-08-06 DIAGNOSIS — R0989 Other specified symptoms and signs involving the circulatory and respiratory systems: Secondary | ICD-10-CM | POA: Diagnosis not present

## 2017-08-06 DIAGNOSIS — I6523 Occlusion and stenosis of bilateral carotid arteries: Secondary | ICD-10-CM

## 2017-08-06 DIAGNOSIS — I714 Abdominal aortic aneurysm, without rupture, unspecified: Secondary | ICD-10-CM

## 2017-08-06 DIAGNOSIS — R1900 Intra-abdominal and pelvic swelling, mass and lump, unspecified site: Secondary | ICD-10-CM

## 2017-08-06 DIAGNOSIS — I6521 Occlusion and stenosis of right carotid artery: Secondary | ICD-10-CM | POA: Diagnosis not present

## 2017-08-06 DIAGNOSIS — I10 Essential (primary) hypertension: Secondary | ICD-10-CM | POA: Insufficient documentation

## 2017-08-06 LAB — VAS US CAROTID
LCCADDIAS: -23 cm/s
LCCAPSYS: 94 cm/s
LEFT ECA DIAS: -21 cm/s
LEFT VERTEBRAL DIAS: -17 cm/s
LICADDIAS: -25 cm/s
LICADSYS: -75 cm/s
Left CCA dist sys: -111 cm/s
Left CCA prox dias: 16 cm/s
Left ICA prox dias: -26 cm/s
Left ICA prox sys: -120 cm/s
RCCADSYS: -109 cm/s
RCCAPDIAS: 12 cm/s
RIGHT CCA MID DIAS: 17 cm/s
RIGHT ECA DIAS: -6 cm/s
RIGHT VERTEBRAL DIAS: -14 cm/s
Right CCA prox sys: 80 cm/s

## 2017-08-06 NOTE — Progress Notes (Signed)
HISTORY AND PHYSICAL     CC:  Follow up carotid Requesting Provider:  Tracie Harrier, MD  HPI: This is a 77 y.o. female who presents today for follow up for right carotid endarterectomy in combination with CABG in 2005.  She continues to be asymptomatic and denies any weakness/numbness/clumsiness of any extremity.  She does not have any speech difficulty and has not had any temporary vision loss.  She stays very active taking care of her 3 cats and doing a cardiopulmonary class.    She takes a daily aspirin.  The pt is on a statin for cholesterol management.  She is on a beta blocker and CCB for blood pressure management.    Since she was last here, she had lesions removed from her skin that was squamous cell carcinoma.    She has never smoked.   Past Medical History:  Diagnosis Date  . Cancer ([place])    basal and squamous cell skin  . Carotid artery occlusion   . Coronary artery disease   . Glaucoma   . Hyperlipidemia   . Hypertension     Past Surgical History:  Procedure Laterality Date  . CAROTID ENDARTERECTOMY  2005  . CATARACT EXTRACTION, BILATERAL    . CORONARY ARTERY BYPASS GRAFT  2005  . EYE SURGERY     cataract  . SKIN CANCER EXCISION      Allergies  Allergen Reactions  . Penicillins Rash    Other reaction(s): Unknown    Current Outpatient Medications  Medication Sig Dispense Refill  . amLODipine (NORVASC) 2.5 MG tablet Take 2.5 mg by mouth daily.    Marland Kitchen aspirin 325 MG EC tablet Take 325 mg by mouth daily.    . metoprolol tartrate (LOPRESSOR) 25 MG tablet Take 25 mg by mouth daily. Reported on 11/30/2015    . Probiotic Product (ALIGN PO) Take by mouth as needed.     . rosuvastatin (CRESTOR) 5 MG tablet Take 5 mg by mouth daily.    . timolol (BETIMOL) 0.25 % ophthalmic solution Place 1-2 drops into the right eye 2 (two) times daily.    . Vitamin D, Ergocalciferol, (DRISDOL) 50000 UNITS CAPS capsule Take 50,000 Units by mouth every 7 (seven) days.    .  Cetirizine HCl (ZYRTEC PO) Take 1 tablet by mouth as needed. Reported on 11/30/2015     No current facility-administered medications for this visit.     Family History  Problem Relation Age of Onset  . Other Other        premature atherosclerotic disease  . Hypertension Father   . Heart disease Father   . Heart disease Mother   . Hypertension Mother   . Arthritis Sister     Social History   Socioeconomic History  . Marital status: Widowed    Spouse name: Not on file  . Number of children: Not on file  . Years of education: Not on file  . Highest education level: Not on file  Social Needs  . Financial resource strain: Not on file  . Food insecurity - worry: Not on file  . Food insecurity - inability: Not on file  . Transportation needs - medical: Not on file  . Transportation needs - non-medical: Not on file  Occupational History  . Not on file  Tobacco Use  . Smoking status: Never Smoker  . Smokeless tobacco: Never Used  Substance and Sexual Activity  . Alcohol use: Yes    Alcohol/week: 1.2 oz  Types: 1 Glasses of wine, 1 Cans of beer per week  . Drug use: No  . Sexual activity: Not on file  Other Topics Concern  . Not on file  Social History Narrative  . Not on file     REVIEW OF SYSTEMS:   [X]  denotes positive finding, [ ]  denotes negative finding Cardiac  Comments:  Chest pain or chest pressure:    Shortness of breath upon exertion:    Short of breath when lying flat:    Irregular heart rhythm:        Vascular    Pain in calf, thigh, or hip brought on by ambulation:    Pain in feet at night that wakes you up from your sleep:     Blood clot in your veins:    Leg swelling:         Pulmonary    Oxygen at home:    Productive cough:     Wheezing:         Neurologic    Sudden weakness in arms or legs:     Sudden numbness in arms or legs:     Sudden onset of difficulty speaking or slurred speech:    Temporary loss of vision in one eye:       Problems with dizziness:         Gastrointestinal    Blood in stool:     Vomited blood:         Genitourinary    Burning when urinating:     Blood in urine:        Psychiatric    Major depression:         Hematologic    Bleeding problems:    Problems with blood clotting too easily:        Skin    Rashes or ulcers:        Constitutional    Fever or chills:      PHYSICAL EXAMINATION:  Vitals:   08/06/17 1350 08/06/17 1352  BP: 135/78 134/78  Pulse: 69   Resp: 16   Temp: 97.8 F (36.6 C)   SpO2: 97%    Body mass index is 18.56 kg/m.  General:  WDWN in NAD; vital signs documented above Gait: Normal HENT: WNL, normocephalic Pulmonary: normal non-labored breathing , without Rales, rhonchi,  wheezing Cardiac: regular HR, without  Murmurs, rubs or gallops; without carotid bruits Abdomen: soft and non tender to palpation.  Her aorta is easily palpable Skin: without rashes Vascular Exam/Pulses:  Right Left  Radial 2+ (normal) 2+ (normal)  Ulnar Unable to palpate  Unable to palpate   Femoral 2+ (normal) 2+ (normal)  Popliteal Unable to palpate  Unable to palpate    Extremities: without ischemic changes, without Gangrene , without cellulitis; without open wounds;  Musculoskeletal: no muscle wasting or atrophy  Neurologic: A&O X 3;  No focal weakness or paresthesias are detected Psychiatric:  The pt has Normal affect.   Non-Invasive Vascular Imaging:   Carotid Duplex on 08/06/17: 1-39% bilateral carotid artery stenosis.  The ECA on the left is >50%.  There is known thrombus/plaque visualized in the carotid bifurcation.   Pt meds includes: Statin:  Yes.   Beta Blocker:  Yes.   Aspirin:  Yes.   ACEI:  No. ARB:  No. CCB use:  Yes Other Antiplatelet/Anticoagulant:  No   ASSESSMENT/PLAN:: 77 y.o. female with hx of right carotid endarterectomy in combination with coronary artery bypass grafting in 2005 and  returns today for follow up.    -pt doing well and  is completely asymptomatic from her carotid disease.  She continues to take her aspirin and statin daily.   Her blood pressure is well controlled. -on physical exam, her abdominal aorta is easily palpable-will schedule her for abdominal u/s at [facility] to evaluate this.  She has been asymptomatic.   -she will f/u in one year with our NP with carotid duplex.    Leontine Locket, PA-C [facility] [phone]  Clinic MD:  Pt seen and examined with Dr. Donnetta Hutching  I have examined the patient, reviewed and agree with above.  Continues to do extremely well from the standpoint of her carotid surgery.  I agree with physical exam with prominent aortic pulsation.  She is extremely thin and most likely this is normal.  Will obtain ultrasound to confirm no evidence of aneurysm  Curt Jews, MD 08/06/2017 2:56 PM

## 2017-08-07 ENCOUNTER — Other Ambulatory Visit: Payer: Self-pay

## 2017-08-14 ENCOUNTER — Ambulatory Visit
Admission: RE | Admit: 2017-08-14 | Discharge: 2017-08-14 | Disposition: A | Discharge status: Home / Self Care | Payer: Medicare HMO | Source: Ambulatory Visit | Attending: Vascular Surgery | Admitting: Vascular Surgery

## 2017-08-14 DIAGNOSIS — Z8679 Personal history of other diseases of the circulatory system: Secondary | ICD-10-CM | POA: Diagnosis not present

## 2017-08-14 DIAGNOSIS — I714 Abdominal aortic aneurysm, without rupture, unspecified: Secondary | ICD-10-CM

## 2017-08-14 DIAGNOSIS — Z136 Encounter for screening for cardiovascular disorders: Secondary | ICD-10-CM | POA: Diagnosis not present

## 2017-08-14 DIAGNOSIS — R1900 Intra-abdominal and pelvic swelling, mass and lump, unspecified site: Secondary | ICD-10-CM | POA: Diagnosis not present

## 2017-10-09 DIAGNOSIS — I10 Essential (primary) hypertension: Secondary | ICD-10-CM | POA: Diagnosis not present

## 2017-10-09 DIAGNOSIS — C4499 Other specified malignant neoplasm of skin, unspecified: Secondary | ICD-10-CM | POA: Diagnosis not present

## 2017-10-09 DIAGNOSIS — H409 Unspecified glaucoma: Secondary | ICD-10-CM | POA: Diagnosis not present

## 2017-10-09 DIAGNOSIS — E7800 Pure hypercholesterolemia, unspecified: Secondary | ICD-10-CM | POA: Diagnosis not present

## 2017-10-09 DIAGNOSIS — R8290 Unspecified abnormal findings in urine: Secondary | ICD-10-CM | POA: Diagnosis not present

## 2017-10-09 DIAGNOSIS — Z0000 Encounter for general adult medical examination without abnormal findings: Secondary | ICD-10-CM | POA: Diagnosis not present

## 2017-10-09 DIAGNOSIS — I25810 Atherosclerosis of coronary artery bypass graft(s) without angina pectoris: Secondary | ICD-10-CM | POA: Diagnosis not present

## 2017-10-17 DIAGNOSIS — R739 Hyperglycemia, unspecified: Secondary | ICD-10-CM | POA: Diagnosis not present

## 2017-10-17 DIAGNOSIS — H409 Unspecified glaucoma: Secondary | ICD-10-CM | POA: Diagnosis not present

## 2017-10-17 DIAGNOSIS — Z0000 Encounter for general adult medical examination without abnormal findings: Secondary | ICD-10-CM | POA: Diagnosis not present

## 2017-10-17 DIAGNOSIS — I10 Essential (primary) hypertension: Secondary | ICD-10-CM | POA: Diagnosis not present

## 2017-10-17 DIAGNOSIS — E7800 Pure hypercholesterolemia, unspecified: Secondary | ICD-10-CM | POA: Diagnosis not present

## 2017-10-17 DIAGNOSIS — I25810 Atherosclerosis of coronary artery bypass graft(s) without angina pectoris: Secondary | ICD-10-CM | POA: Diagnosis not present

## 2018-04-23 DIAGNOSIS — L570 Actinic keratosis: Secondary | ICD-10-CM | POA: Diagnosis not present

## 2018-04-23 DIAGNOSIS — D0472 Carcinoma in situ of skin of left lower limb, including hip: Secondary | ICD-10-CM | POA: Diagnosis not present

## 2018-04-23 DIAGNOSIS — Z872 Personal history of diseases of the skin and subcutaneous tissue: Secondary | ICD-10-CM | POA: Diagnosis not present

## 2018-04-23 DIAGNOSIS — C44712 Basal cell carcinoma of skin of right lower limb, including hip: Secondary | ICD-10-CM | POA: Diagnosis not present

## 2018-04-23 DIAGNOSIS — D0471 Carcinoma in situ of skin of right lower limb, including hip: Secondary | ICD-10-CM | POA: Diagnosis not present

## 2018-04-23 DIAGNOSIS — Z08 Encounter for follow-up examination after completed treatment for malignant neoplasm: Secondary | ICD-10-CM | POA: Diagnosis not present

## 2018-04-23 DIAGNOSIS — D485 Neoplasm of uncertain behavior of skin: Secondary | ICD-10-CM | POA: Diagnosis not present

## 2018-04-23 DIAGNOSIS — Z85828 Personal history of other malignant neoplasm of skin: Secondary | ICD-10-CM | POA: Diagnosis not present

## 2018-04-30 DIAGNOSIS — I10 Essential (primary) hypertension: Secondary | ICD-10-CM | POA: Diagnosis not present

## 2018-04-30 DIAGNOSIS — R739 Hyperglycemia, unspecified: Secondary | ICD-10-CM | POA: Diagnosis not present

## 2018-04-30 DIAGNOSIS — E7800 Pure hypercholesterolemia, unspecified: Secondary | ICD-10-CM | POA: Diagnosis not present

## 2018-04-30 DIAGNOSIS — I25810 Atherosclerosis of coronary artery bypass graft(s) without angina pectoris: Secondary | ICD-10-CM | POA: Diagnosis not present

## 2018-04-30 DIAGNOSIS — H409 Unspecified glaucoma: Secondary | ICD-10-CM | POA: Diagnosis not present

## 2018-05-07 DIAGNOSIS — R7989 Other specified abnormal findings of blood chemistry: Secondary | ICD-10-CM | POA: Diagnosis not present

## 2018-05-07 DIAGNOSIS — Z833 Family history of diabetes mellitus: Secondary | ICD-10-CM | POA: Diagnosis not present

## 2018-05-07 DIAGNOSIS — I10 Essential (primary) hypertension: Secondary | ICD-10-CM | POA: Diagnosis not present

## 2018-05-07 DIAGNOSIS — E7800 Pure hypercholesterolemia, unspecified: Secondary | ICD-10-CM | POA: Diagnosis not present

## 2018-05-07 DIAGNOSIS — Z0000 Encounter for general adult medical examination without abnormal findings: Secondary | ICD-10-CM | POA: Diagnosis not present

## 2018-05-07 DIAGNOSIS — Z23 Encounter for immunization: Secondary | ICD-10-CM | POA: Diagnosis not present

## 2018-05-07 DIAGNOSIS — I25810 Atherosclerosis of coronary artery bypass graft(s) without angina pectoris: Secondary | ICD-10-CM | POA: Diagnosis not present

## 2018-05-07 DIAGNOSIS — C4499 Other specified malignant neoplasm of skin, unspecified: Secondary | ICD-10-CM | POA: Diagnosis not present

## 2018-05-08 DIAGNOSIS — C44712 Basal cell carcinoma of skin of right lower limb, including hip: Secondary | ICD-10-CM | POA: Diagnosis not present

## 2018-05-22 DIAGNOSIS — D0471 Carcinoma in situ of skin of right lower limb, including hip: Secondary | ICD-10-CM | POA: Diagnosis not present

## 2018-05-22 DIAGNOSIS — D0472 Carcinoma in situ of skin of left lower limb, including hip: Secondary | ICD-10-CM | POA: Diagnosis not present

## 2018-05-22 DIAGNOSIS — L570 Actinic keratosis: Secondary | ICD-10-CM | POA: Diagnosis not present

## 2018-07-22 DIAGNOSIS — H401131 Primary open-angle glaucoma, bilateral, mild stage: Secondary | ICD-10-CM | POA: Diagnosis not present

## 2018-08-19 DIAGNOSIS — H26491 Other secondary cataract, right eye: Secondary | ICD-10-CM | POA: Diagnosis not present

## 2018-08-26 ENCOUNTER — Other Ambulatory Visit: Payer: Self-pay

## 2018-08-26 DIAGNOSIS — I6523 Occlusion and stenosis of bilateral carotid arteries: Secondary | ICD-10-CM

## 2018-08-26 NOTE — Progress Notes (Signed)
HISTORY AND PHYSICAL     CC:  follow up. Requesting Provider:  Tracie Harrier, MD  HPI: This is a 78 y.o. female here for follow up for carotid artery stenosis.  Pt is s/p right CEA/CABG in 2005 by Dr. Donnetta Hutching.  Pt was last seen 08/06/17 by Dr. Donnetta Hutching and at that time, she was doing well and was asymptomatic.    She is here today for follow up.  She states she is doing well.  She denies any amaurosis fugax, speech difficulties, facial droop, clumsiness or hemiparesis of an extremity.  She states she had surgery on her right eye last week and is seeing some floaters.  Her vision has improved with surgery.  She also has had some skin cancers removed since her last visit.   The pt is on a statin for cholesterol management.  The pt is not diabetic.   The pt is on BB and CCB for hypertension.   Tobacco hx:  never The pt none on a daily aspirin. Other AC:  none    Past Medical History:  Diagnosis Date  . Cancer ([place])    basal and squamous cell skin  . Carotid artery occlusion   . Coronary artery disease   . Glaucoma   . Hyperlipidemia   . Hypertension     Past Surgical History:  Procedure Laterality Date  . CAROTID ENDARTERECTOMY  2005  . CATARACT EXTRACTION, BILATERAL    . CORONARY ARTERY BYPASS GRAFT  2005  . EYE SURGERY     cataract  . SKIN CANCER EXCISION      Allergies  Allergen Reactions  . Penicillins Rash    Other reaction(s): Unknown    Current Outpatient Medications  Medication Sig Dispense Refill  . amLODipine (NORVASC) 2.5 MG tablet Take 2.5 mg by mouth daily.    Marland Kitchen aspirin 325 MG EC tablet Take 325 mg by mouth daily.    . Cetirizine HCl (ZYRTEC PO) Take 1 tablet by mouth as needed. Reported on 11/30/2015    . metoprolol tartrate (LOPRESSOR) 25 MG tablet Take 25 mg by mouth daily. Reported on 11/30/2015    . Probiotic Product (ALIGN PO) Take by mouth as needed.     . rosuvastatin (CRESTOR) 5 MG tablet Take 5 mg by mouth daily.    . timolol (BETIMOL) 0.25 %  ophthalmic solution Place 1-2 drops into the right eye 2 (two) times daily.    . Vitamin D, Ergocalciferol, (DRISDOL) 50000 UNITS CAPS capsule Take 50,000 Units by mouth every 7 (seven) days.     No current facility-administered medications for this visit.     Family History  Problem Relation Age of Onset  . Other Other        premature atherosclerotic disease  . Hypertension Father   . Heart disease Father   . Heart disease Mother   . Hypertension Mother   . Arthritis Sister     Social History   Socioeconomic History  . Marital status: Widowed    Spouse name: Not on file  . Number of children: Not on file  . Years of education: Not on file  . Highest education level: Not on file  Occupational History  . Not on file  Social Needs  . Financial resource strain: Not on file  . Food insecurity:    Worry: Not on file    Inability: Not on file  . Transportation needs:    Medical: Not on file    Non-medical: Not on  file  Tobacco Use  . Smoking status: Never Smoker  . Smokeless tobacco: Never Used  Substance and Sexual Activity  . Alcohol use: Yes    Alcohol/week: 2.0 standard drinks    Types: 1 Glasses of wine, 1 Cans of beer per week  . Drug use: No  . Sexual activity: Not on file  Lifestyle  . Physical activity:    Days per week: Not on file    Minutes per session: Not on file  . Stress: Not on file  Relationships  . Social connections:    Talks on phone: Not on file    Gets together: Not on file    Attends religious service: Not on file    Active member of club or organization: Not on file    Attends meetings of clubs or organizations: Not on file    Relationship status: Not on file  . Intimate partner violence:    Fear of current or ex partner: Not on file    Emotionally abused: Not on file    Physically abused: Not on file    Forced sexual activity: Not on file  Other Topics Concern  . Not on file  Social History Narrative  . Not on file     REVIEW  OF SYSTEMS:   [X]  denotes positive finding, [ ]  denotes negative finding Cardiac  Comments:  Chest pain or chest pressure:    Shortness of breath upon exertion:    Short of breath when lying flat:    Irregular heart rhythm:        Vascular    Pain in calf, thigh, or hip brought on by ambulation:    Pain in feet at night that wakes you up from your sleep:     Blood clot in your veins:    Leg swelling:         Pulmonary    Oxygen at home:    Productive cough:     Wheezing:         Neurologic    Sudden weakness in arms or legs:     Sudden numbness in arms or legs:     Sudden onset of difficulty speaking or slurred speech:    Temporary loss of vision in one eye:     Problems with dizziness:         Gastrointestinal    Blood in stool:     Vomited blood:         Genitourinary    Burning when urinating:     Blood in urine:        Psychiatric    Major depression:         Hematologic    Bleeding problems:    Problems with blood clotting too easily:        Skin    Rashes or ulcers:        Constitutional    Fever or chills:      PHYSICAL EXAMINATION:  Today's Vitals   08/27/18 1333  BP: (!) 172/74  Pulse: 61  Resp: 14  Temp: (!) 97.2 F (36.2 C)  TempSrc: Oral  SpO2: 100%  Weight: 119 lb 4.3 oz (54.1 kg)  Height: 5' 6.5" (1.689 m)   Body mass index is 18.96 kg/m.  General:  WDWN in NAD; vital signs documented above Gait: Not observed HENT: WNL, normocephalic Pulmonary: normal non-labored breathing , without Rales, rhonchi,  wheezing Cardiac: regular HR, without  Murmurs, rubs or gallops; without carotid bruits  Abdomen: soft, NT, no masses; abdominal aorta is palpable  Skin: without rashes; well healed sternotomy and right cea scar. Vascular Exam/Pulses:  Right Left  Radial 2+ (normal) 2+ (normal)  Popliteal Unable to palpate  Unable to palpate   DP 2+ (normal) 2+ (normal)  PT 2+ (normal) 2+ (normal)   Extremities: without ischemic changes,  without Gangrene , without cellulitis; without open wounds; spider veins and superficial varicosities present BLE; no edema Musculoskeletal: no muscle wasting or atrophy  Neurologic: A&O X 3 Psychiatric:  The pt has Normal affect.   Non-Invasive Vascular Imaging:   Carotid Duplex on 08/27/2018: Right:  1-39% stenosis Left:  1-39% stenosis Vertebrals:  Bilateral vertebral arteries demonstrate antegrade flow. Subclavians: Right subclavian artery flow was disturbed. Normal flow  hemodynamics were seen in the left subclavian artery.  Previous Carotid duplex on 08/06/17: Right: 1-39% stenosis Left:   1-39% stenosis The ECA on the left is >50%.  There is known thrombus/plaque visualized in the carotid bifurcation.    ASSESSMENT/PLAN:: 78 y.o. female here for follow up carotid artery stenosis with hx of right CEA/CABG in 2005 by Dr. Donnetta Hutching   -pt doing well and remains asymptomatic.   -she was sent for AAA duplex at her last visit due to palpable aorta and that was normal.  -discussed s/s of stroke with pt and they understand should they develop any of these sx, they will go to the nearest ER. -she will f/u in one year with carotid duplex and see APP.  Leontine Locket, PA-C [facility] [phone]  Clinic MD:  Oneida Alar

## 2018-08-27 ENCOUNTER — Ambulatory Visit: Payer: Medicare HMO | Admitting: Physician Assistant

## 2018-08-27 ENCOUNTER — Other Ambulatory Visit: Payer: Self-pay

## 2018-08-27 ENCOUNTER — Ambulatory Visit (HOSPITAL_COMMUNITY)
Admission: RE | Admit: 2018-08-27 | Discharge: 2018-08-27 | Disposition: A | Discharge status: Home / Self Care | Payer: Medicare HMO | Source: Ambulatory Visit | Attending: Vascular Surgery | Admitting: Vascular Surgery

## 2018-08-27 VITALS — BP 172/74 | HR 61 | Temp 97.2°F | Resp 14 | Ht 66.5 in | Wt 119.3 lb

## 2018-08-27 DIAGNOSIS — I6523 Occlusion and stenosis of bilateral carotid arteries: Secondary | ICD-10-CM | POA: Diagnosis not present

## 2018-12-31 DIAGNOSIS — I10 Essential (primary) hypertension: Secondary | ICD-10-CM | POA: Diagnosis not present

## 2018-12-31 DIAGNOSIS — I25810 Atherosclerosis of coronary artery bypass graft(s) without angina pectoris: Secondary | ICD-10-CM | POA: Diagnosis not present

## 2018-12-31 DIAGNOSIS — Z23 Encounter for immunization: Secondary | ICD-10-CM | POA: Diagnosis not present

## 2018-12-31 DIAGNOSIS — C4499 Other specified malignant neoplasm of skin, unspecified: Secondary | ICD-10-CM | POA: Diagnosis not present

## 2018-12-31 DIAGNOSIS — R7989 Other specified abnormal findings of blood chemistry: Secondary | ICD-10-CM | POA: Diagnosis not present

## 2018-12-31 DIAGNOSIS — Z833 Family history of diabetes mellitus: Secondary | ICD-10-CM | POA: Diagnosis not present

## 2018-12-31 DIAGNOSIS — E7800 Pure hypercholesterolemia, unspecified: Secondary | ICD-10-CM | POA: Diagnosis not present

## 2019-01-07 DIAGNOSIS — R7989 Other specified abnormal findings of blood chemistry: Secondary | ICD-10-CM | POA: Diagnosis not present

## 2019-01-07 DIAGNOSIS — E7800 Pure hypercholesterolemia, unspecified: Secondary | ICD-10-CM | POA: Diagnosis not present

## 2019-01-07 DIAGNOSIS — Z0000 Encounter for general adult medical examination without abnormal findings: Secondary | ICD-10-CM | POA: Diagnosis not present

## 2019-01-07 DIAGNOSIS — I10 Essential (primary) hypertension: Secondary | ICD-10-CM | POA: Diagnosis not present

## 2019-01-07 DIAGNOSIS — I25810 Atherosclerosis of coronary artery bypass graft(s) without angina pectoris: Secondary | ICD-10-CM | POA: Diagnosis not present

## 2019-01-07 DIAGNOSIS — Z23 Encounter for immunization: Secondary | ICD-10-CM | POA: Diagnosis not present

## 2019-01-07 DIAGNOSIS — H409 Unspecified glaucoma: Secondary | ICD-10-CM | POA: Diagnosis not present

## 2019-02-05 DIAGNOSIS — H401131 Primary open-angle glaucoma, bilateral, mild stage: Secondary | ICD-10-CM | POA: Diagnosis not present

## 2019-04-08 DIAGNOSIS — D485 Neoplasm of uncertain behavior of skin: Secondary | ICD-10-CM | POA: Diagnosis not present

## 2019-04-08 DIAGNOSIS — Z85828 Personal history of other malignant neoplasm of skin: Secondary | ICD-10-CM | POA: Diagnosis not present

## 2019-04-08 DIAGNOSIS — Z08 Encounter for follow-up examination after completed treatment for malignant neoplasm: Secondary | ICD-10-CM | POA: Diagnosis not present

## 2019-04-08 DIAGNOSIS — X32XXXA Exposure to sunlight, initial encounter: Secondary | ICD-10-CM | POA: Diagnosis not present

## 2019-04-08 DIAGNOSIS — Z872 Personal history of diseases of the skin and subcutaneous tissue: Secondary | ICD-10-CM | POA: Diagnosis not present

## 2019-04-08 DIAGNOSIS — D0471 Carcinoma in situ of skin of right lower limb, including hip: Secondary | ICD-10-CM | POA: Diagnosis not present

## 2019-04-08 DIAGNOSIS — L570 Actinic keratosis: Secondary | ICD-10-CM | POA: Diagnosis not present

## 2019-04-10 DIAGNOSIS — Z23 Encounter for immunization: Secondary | ICD-10-CM | POA: Diagnosis not present

## 2019-04-29 DIAGNOSIS — D0471 Carcinoma in situ of skin of right lower limb, including hip: Secondary | ICD-10-CM | POA: Diagnosis not present

## 2019-07-06 DIAGNOSIS — R7989 Other specified abnormal findings of blood chemistry: Secondary | ICD-10-CM | POA: Diagnosis not present

## 2019-07-06 DIAGNOSIS — I10 Essential (primary) hypertension: Secondary | ICD-10-CM | POA: Diagnosis not present

## 2019-07-06 DIAGNOSIS — H409 Unspecified glaucoma: Secondary | ICD-10-CM | POA: Diagnosis not present

## 2019-07-06 DIAGNOSIS — E7800 Pure hypercholesterolemia, unspecified: Secondary | ICD-10-CM | POA: Diagnosis not present

## 2019-07-06 DIAGNOSIS — I25810 Atherosclerosis of coronary artery bypass graft(s) without angina pectoris: Secondary | ICD-10-CM | POA: Diagnosis not present

## 2019-08-04 DIAGNOSIS — H401111 Primary open-angle glaucoma, right eye, mild stage: Secondary | ICD-10-CM | POA: Diagnosis not present

## 2019-08-22 IMAGING — US US ABDOMINAL AORTA SCREENING AAA
1 series · 14 of 25 positions shown · non-contrast
Comparison: None.

CLINICAL DATA: 76-year-old female with history of abdominal aortic
aneurysm

EXAM:
ULTRASOUND OF ABDOMINAL AORTA
ULTRASOUND OF BILATERAL ILIAC ARTERIES
TECHNIQUE: Ultrasound examination of the abdominal aorta was performed to
evaluate for abdominal aortic aneurysm.

[Series 1: us abdominal aorta screening aaa · 0.19mm/px · 14 of 99 slices shown]
[im 1/99]
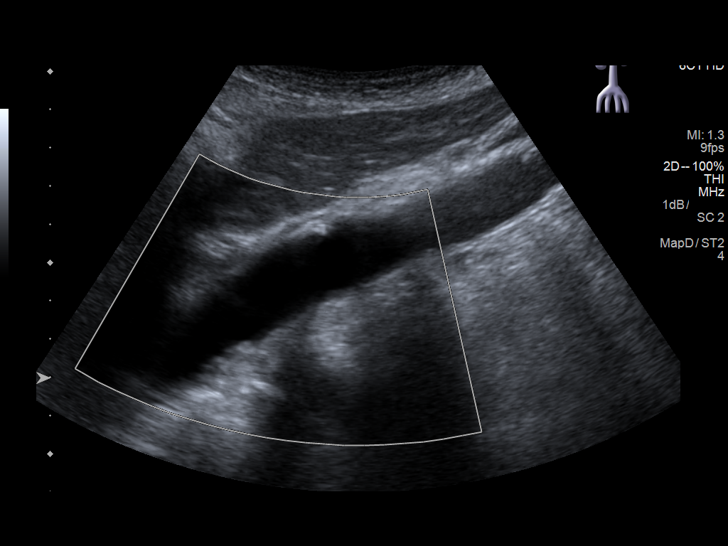
[im 9/99]
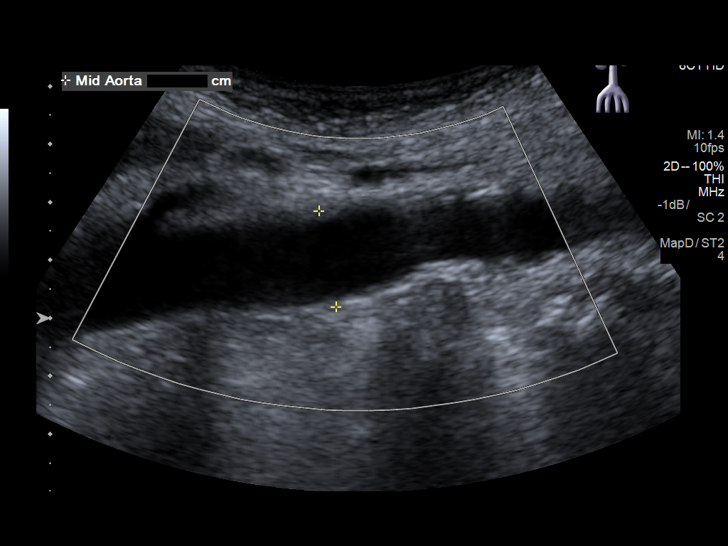
[im 17/99]
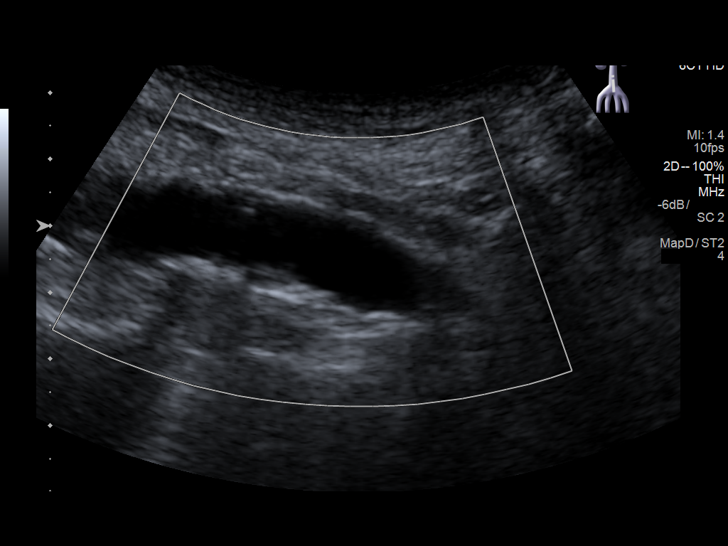
[im 25/99]
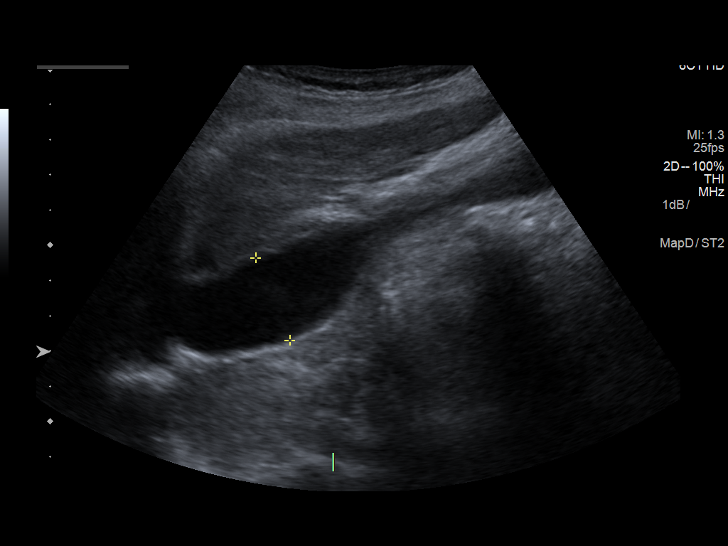
[im 33/99]
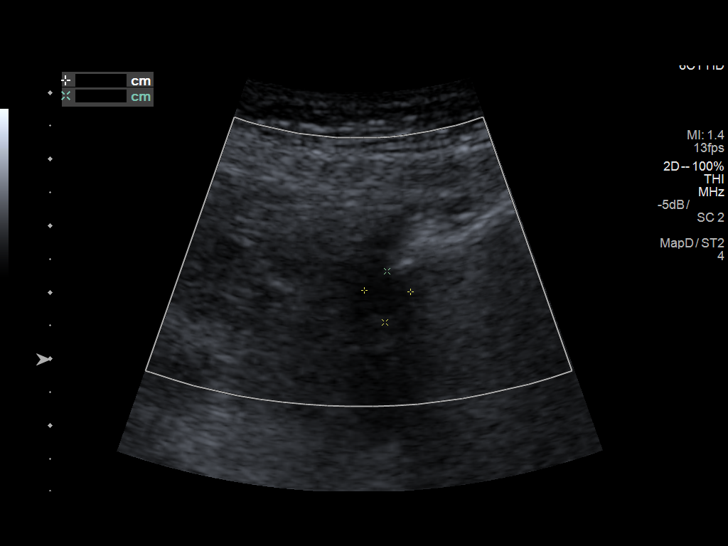
[im 37/99]
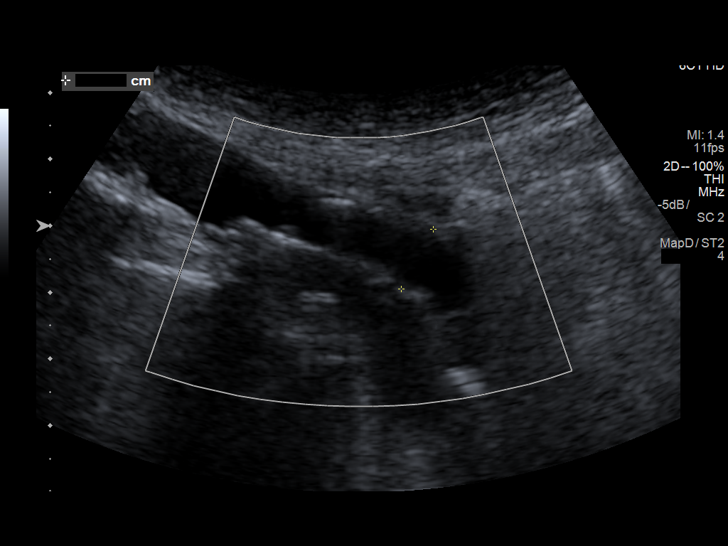
[im 45/99]
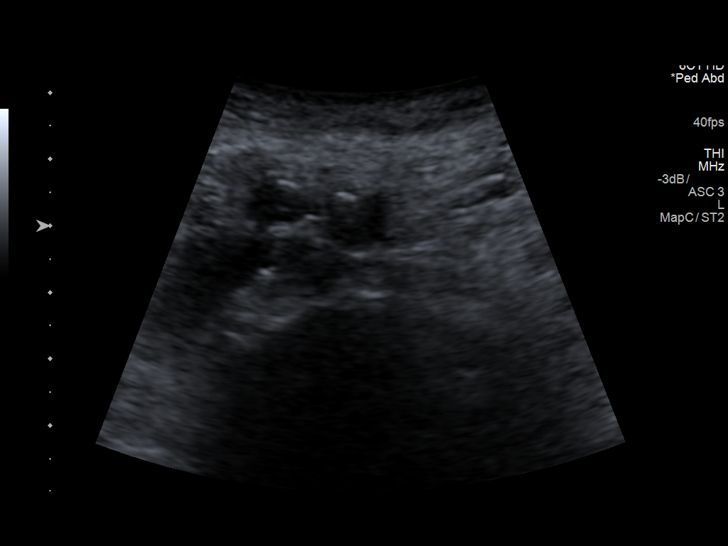
[im 54/99]
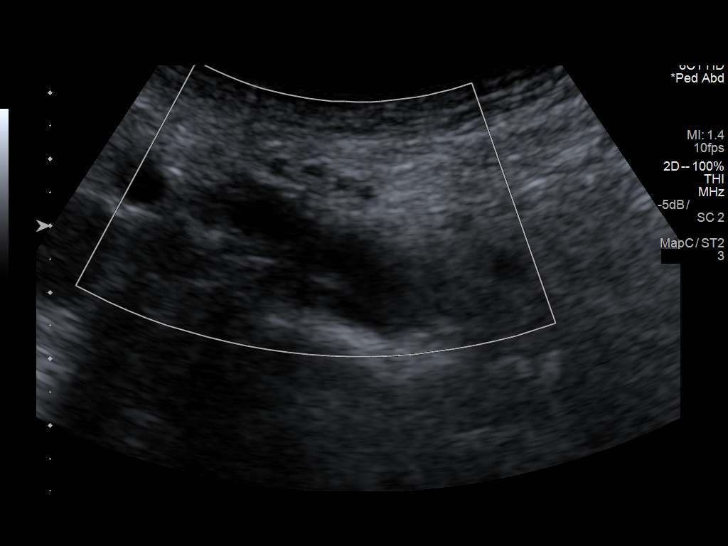
[im 62/99]
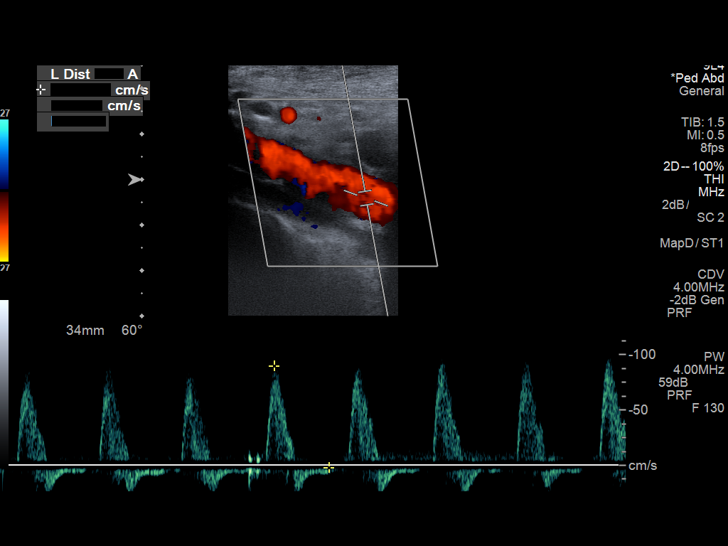
[im 66/99]
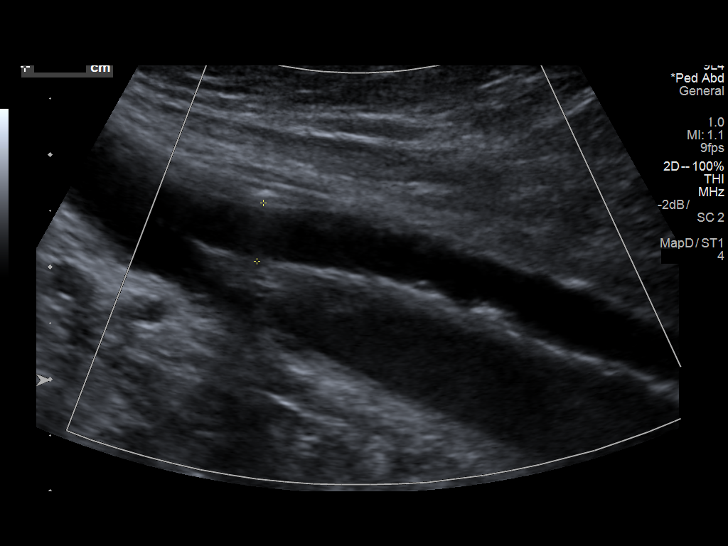
[im 74/99]
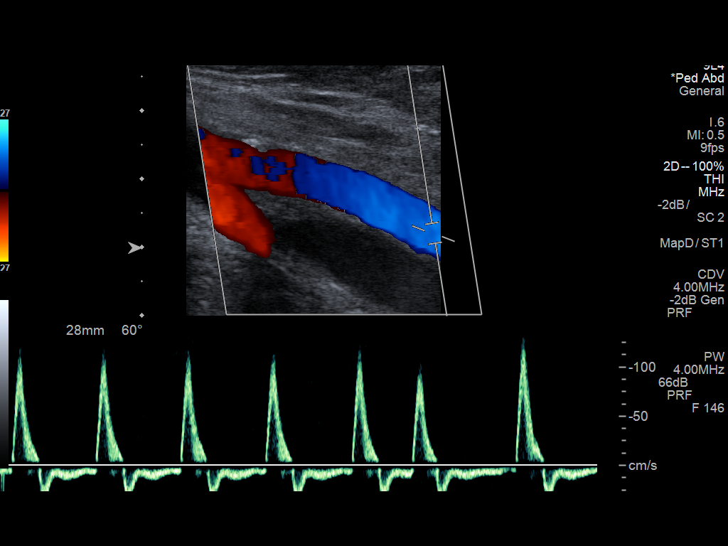
[im 82/99]
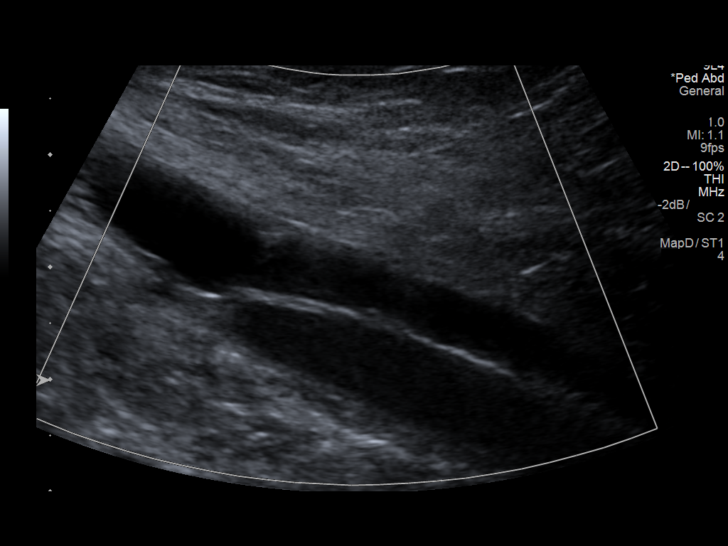
[im 90/99]
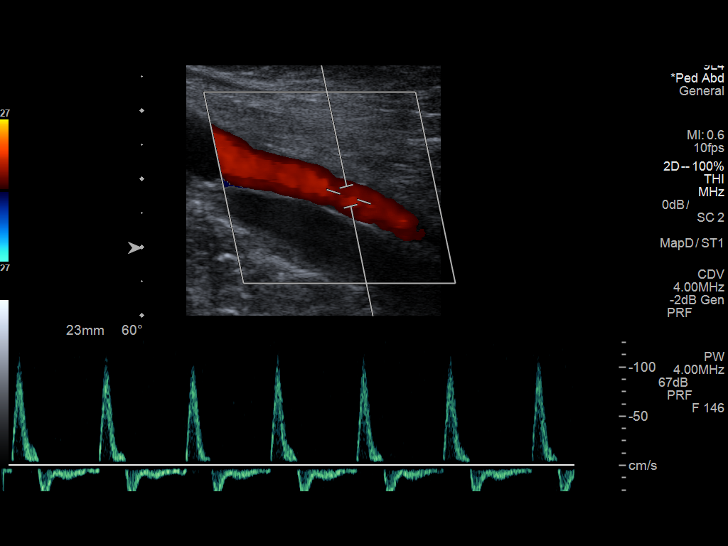
[im 99/99]
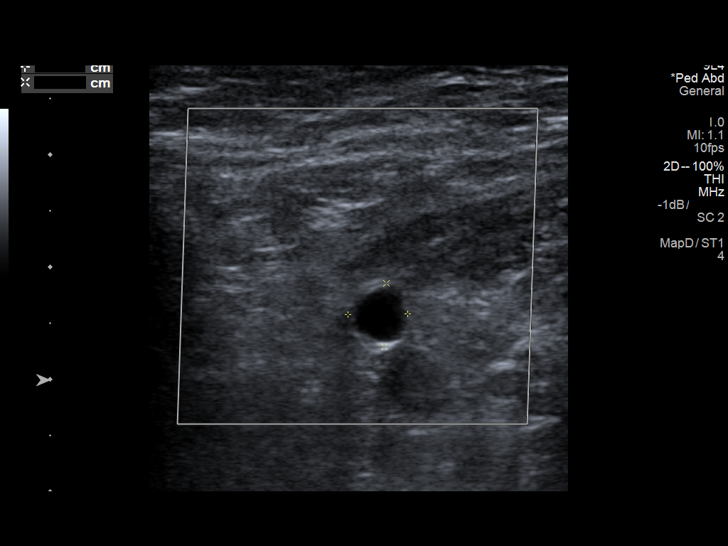

[14 of 25 positions shown; findings below may reference images not displayed]

FINDINGS: Abdominal Aorta

Proximal diameter:  2.2 cm

Velocity:  78 cm/sec

Mid diameter: 1.6cm

Velocity:  73 cm/sec

Distal diameter: 1.7cm

Velocity:  88 cm/sec

Iliac vessels:

Right:

Diameter:  0.9 cm

Velocity:

Proximal:  142 cm/sec

Mid: 143 cm/sec

Distal: 116 cm/sec

Left:

Diameter:  1.1 cm

Velocity:

Proximal:  109 cm/sec

Mid: 102 cm/sec

Distal: 111 cm/sec
IMPRESSION: Sonographic survey demonstrates no evidence of abdominal aortic
aneurysm.

Unremarkable waveforms of the bilateral iliac arteries, with no
elevated velocities measured.

## 2019-10-06 DIAGNOSIS — Z79899 Other long term (current) drug therapy: Secondary | ICD-10-CM | POA: Diagnosis not present

## 2019-10-06 DIAGNOSIS — Z0000 Encounter for general adult medical examination without abnormal findings: Secondary | ICD-10-CM | POA: Diagnosis not present

## 2019-10-06 DIAGNOSIS — Z8679 Personal history of other diseases of the circulatory system: Secondary | ICD-10-CM | POA: Diagnosis not present

## 2019-10-06 DIAGNOSIS — R7989 Other specified abnormal findings of blood chemistry: Secondary | ICD-10-CM | POA: Diagnosis not present

## 2019-10-06 DIAGNOSIS — E785 Hyperlipidemia, unspecified: Secondary | ICD-10-CM | POA: Diagnosis not present

## 2019-10-06 DIAGNOSIS — Z951 Presence of aortocoronary bypass graft: Secondary | ICD-10-CM | POA: Diagnosis not present

## 2019-10-06 DIAGNOSIS — I10 Essential (primary) hypertension: Secondary | ICD-10-CM | POA: Diagnosis not present

## 2019-10-06 DIAGNOSIS — I2510 Atherosclerotic heart disease of native coronary artery without angina pectoris: Secondary | ICD-10-CM | POA: Diagnosis not present

## 2019-10-15 ENCOUNTER — Other Ambulatory Visit: Payer: Self-pay | Admitting: *Deleted

## 2019-10-15 DIAGNOSIS — D2271 Melanocytic nevi of right lower limb, including hip: Secondary | ICD-10-CM | POA: Diagnosis not present

## 2019-10-15 DIAGNOSIS — Z85828 Personal history of other malignant neoplasm of skin: Secondary | ICD-10-CM | POA: Diagnosis not present

## 2019-10-15 DIAGNOSIS — L570 Actinic keratosis: Secondary | ICD-10-CM | POA: Diagnosis not present

## 2019-10-15 DIAGNOSIS — D2261 Melanocytic nevi of right upper limb, including shoulder: Secondary | ICD-10-CM | POA: Diagnosis not present

## 2019-10-15 DIAGNOSIS — D225 Melanocytic nevi of trunk: Secondary | ICD-10-CM | POA: Diagnosis not present

## 2019-10-15 DIAGNOSIS — L821 Other seborrheic keratosis: Secondary | ICD-10-CM | POA: Diagnosis not present

## 2019-10-15 DIAGNOSIS — I6523 Occlusion and stenosis of bilateral carotid arteries: Secondary | ICD-10-CM

## 2019-10-15 DIAGNOSIS — X32XXXA Exposure to sunlight, initial encounter: Secondary | ICD-10-CM | POA: Diagnosis not present

## 2019-10-15 DIAGNOSIS — D2262 Melanocytic nevi of left upper limb, including shoulder: Secondary | ICD-10-CM | POA: Diagnosis not present

## 2019-10-19 ENCOUNTER — Telehealth (HOSPITAL_COMMUNITY): Payer: Self-pay

## 2019-10-19 NOTE — Telephone Encounter (Signed)
The above patient or their representative was contacted and gave the following answers to these questions:         Do you have any of the following symptoms?    NO  Fever                    Cough                   Shortness of breath  Do  you have any of the following other symptoms?    muscle pain         vomiting,        diarrhea        rash         weakness        red eye        abdominal pain         bruising          bruising or bleeding              joint pain           severe headache    Have you been in contact with someone who was or has been sick in the past 2 weeks?  NO  Yes                 Unsure                         Unable to assess   Does the person that you were in contact with have any of the following symptoms?   Cough         shortness of breath           muscle pain         vomiting,            diarrhea            rash            weakness           fever            red eye           abdominal pain           bruising  or  bleeding                joint pain                severe headache                 COMMENTS OR ACTION PLAN FOR THIS PATIENT:       10/19/19 PT IS FULLY VACCINATED/[facility]

## 2019-10-20 ENCOUNTER — Other Ambulatory Visit: Payer: Self-pay

## 2019-10-20 ENCOUNTER — Ambulatory Visit (INDEPENDENT_AMBULATORY_CARE_PROVIDER_SITE_OTHER): Payer: Medicare HMO | Admitting: Physician Assistant

## 2019-10-20 ENCOUNTER — Ambulatory Visit (HOSPITAL_COMMUNITY)
Admission: RE | Admit: 2019-10-20 | Discharge: 2019-10-20 | Disposition: A | Discharge status: Home / Self Care | Payer: Medicare HMO | Source: Ambulatory Visit | Attending: Vascular Surgery | Admitting: Vascular Surgery

## 2019-10-20 VITALS — BP 150/75 | HR 65 | Temp 97.7°F | Ht 66.0 in | Wt 112.0 lb

## 2019-10-20 DIAGNOSIS — I6523 Occlusion and stenosis of bilateral carotid arteries: Secondary | ICD-10-CM | POA: Diagnosis not present

## 2019-10-20 NOTE — Progress Notes (Signed)
HISTORY AND PHYSICAL     CC:  follow up. Requesting Provider:  Tracie Harrier, MD  HPI: This is a 79 y.o. female here for follow up for carotid artery stenosis.  Pt is s/p right CEA/CABG by Dr. Donnetta Hutching in 2005.  Pt was last seen in February 2020 and at that time, she was doing well and remained asymptomatic.    Pt returns today for follow up.  She denies any amaurosis fugax, speech difficulties, weakness, numbness, paralysis or clumsiness.  She has not had any changes over the past year.    The pt is on a statin for cholesterol management.  The pt is on a daily aspirin.   Other AC:  none The pt is on BB, CCB for hypertension.   The pt is not diabetic.   Tobacco hx:  never   Past Medical History:  Diagnosis Date  . Cancer ([place])    basal and squamous cell skin  . Carotid artery occlusion   . Coronary artery disease   . Glaucoma   . Hyperlipidemia   . Hypertension     Past Surgical History:  Procedure Laterality Date  . CAROTID ENDARTERECTOMY  2005  . CATARACT EXTRACTION, BILATERAL    . CORONARY ARTERY BYPASS GRAFT  2005  . EYE SURGERY     cataract  . SKIN CANCER EXCISION      Allergies  Allergen Reactions  . Penicillins Rash    Other reaction(s): Unknown    Current Outpatient Medications  Medication Sig Dispense Refill  . amLODipine (NORVASC) 2.5 MG tablet Take 2.5 mg by mouth daily.    Marland Kitchen aspirin 325 MG EC tablet Take 325 mg by mouth daily.    . Cetirizine HCl (ZYRTEC PO) Take 1 tablet by mouth as needed. Reported on 11/30/2015    . metoprolol tartrate (LOPRESSOR) 25 MG tablet Take 25 mg by mouth daily. Reported on 11/30/2015    . Probiotic Product (ALIGN PO) Take by mouth as needed.     . rosuvastatin (CRESTOR) 5 MG tablet Take 5 mg by mouth daily.    . timolol (BETIMOL) 0.25 % ophthalmic solution Place 1-2 drops into the right eye 2 (two) times daily.    . Vitamin D, Ergocalciferol, (DRISDOL) 50000 UNITS CAPS capsule Take 50,000 Units by mouth every 7 (seven)  days.     No current facility-administered medications for this visit.    Family History  Problem Relation Age of Onset  . Other Other        premature atherosclerotic disease  . Hypertension Father   . Heart disease Father   . Heart disease Mother   . Hypertension Mother   . Arthritis Sister     Social History   Socioeconomic History  . Marital status: Widowed    Spouse name: Not on file  . Number of children: Not on file  . Years of education: Not on file  . Highest education level: Not on file  Occupational History  . Not on file  Tobacco Use  . Smoking status: Never Smoker  . Smokeless tobacco: Never Used  Substance and Sexual Activity  . Alcohol use: Yes    Alcohol/week: 2.0 standard drinks    Types: 1 Glasses of wine, 1 Cans of beer per week  . Drug use: No  . Sexual activity: Not on file  Other Topics Concern  . Not on file  Social History Narrative  . Not on file   Social Determinants of Health  Financial Resource Strain:   . Difficulty of Paying Living Expenses:   Food Insecurity:   . Worried About Charity fundraiser in the Last Year:   . Arboriculturist in the Last Year:   Transportation Needs:   . Film/video editor (Medical):   Marland Kitchen Lack of Transportation (Non-Medical):   Physical Activity:   . Days of Exercise per Week:   . Minutes of Exercise per Session:   Stress:   . Feeling of Stress :   Social Connections:   . Frequency of Communication with Friends and Family:   . Frequency of Social Gatherings with Friends and Family:   . Attends Religious Services:   . Active Member of Clubs or Organizations:   . Attends Archivist Meetings:   Marland Kitchen Marital Status:   Intimate Partner Violence:   . Fear of Current or Ex-Partner:   . Emotionally Abused:   Marland Kitchen Physically Abused:   . Sexually Abused:      REVIEW OF SYSTEMS:   [X]  denotes positive finding, [ ]  denotes negative finding Cardiac  Comments:  Chest pain or chest pressure:     Shortness of breath upon exertion:    Short of breath when lying flat:    Irregular heart rhythm:        Vascular    Pain in calf, thigh, or hip brought on by ambulation:    Pain in feet at night that wakes you up from your sleep:     Blood clot in your veins:    Leg swelling:         Pulmonary    Oxygen at home:    Productive cough:     Wheezing:         Neurologic    Sudden weakness in arms or legs:     Sudden numbness in arms or legs:     Sudden onset of difficulty speaking or slurred speech:    Temporary loss of vision in one eye:     Problems with dizziness:         Gastrointestinal    Blood in stool:     Vomited blood:         Genitourinary    Burning when urinating:     Blood in urine:        Psychiatric    Major depression:         Hematologic    Bleeding problems:    Problems with blood clotting too easily:        Skin    Rashes or ulcers:        Constitutional    Fever or chills:      PHYSICAL EXAMINATION:  Today's Vitals   10/20/19 1254  BP: (!) 150/75  Pulse: 65  Temp: 97.7 F (36.5 C)  Weight: 112 lb (50.8 kg)  Height: 5\' 6"  (1.676 m)   Body mass index is 18.08 kg/m.   General:  WDWN in NAD; vital signs documented above Gait: Normal HENT: WNL, normocephalic Pulmonary: normal non-labored breathing , without Rales, rhonchi,  wheezing Cardiac: regular HR, without  Murmurs, rubs or gallops; without carotid bruits Abdomen: soft, NT, no masses; aorta is palpable Skin: without rashes Vascular Exam/Pulses:  Right Left  Radial 2+ (normal) 2+ (normal)  Ulnar 2+ (normal) Unable to palpate   AT 2+ (normal) 2+ (normal)  PT Unable to palpate  Unable to palpate    Extremities: without ischemic changes, without Gangrene , without  cellulitis; without open wounds; spider veins present BLE Musculoskeletal: no muscle wasting or atrophy  Neurologic: A&O X 3 Psychiatric:  The pt has Normal affect.   Non-Invasive Vascular Imaging:   Carotid  Duplex on 10/20/2019: Right:  1-39% ICA stenosis Left:  1-39% ICA stenosis Vertebrals: Bilateral vertebral arteries demonstrate antegrade flow.  Subclavians: Normal flow hemodynamics were seen in bilateral subclavian arteries.  Previous Carotid duplex on 08/27/2018: Right: 1-39% ICA stenosis Left:   1-39% ICA stenosis Vertebrals: Bilateral vertebral arteries demonstrate antegrade flow.  Subclavians: Right subclavian artery flow was disturbed. Normal flow hemodynamics were seen in the left subclavian artery.  Aorta duplex 08/14/2017: IMPRESSION: Sonographic survey demonstrates no evidence of abdominal aortic aneurysm.  Unremarkable waveforms of the bilateral iliac arteries, with no elevated velocities measured.   ASSESSMENT/PLAN:: 79 y.o. female here for follow up carotid artery stenosis.  She is s/p right CEA/CABG by Dr. Donnetta Hutching in 2005 here today for follow up.  -pt doing well and remains asymptomatic.  Her carotid duplex today reveals 1-39% bilateral ICA stenosis.  Follow up in one year with duplex.   Continue statin/asa -aorta is palpable-pt is very thin and she had an aorta duplex 2 years ago that was normal -discussed s/s of stroke with pt and they understand should they develop any of these sx, they will go to the nearest ER.   Leontine Locket, [facility] [phone]  Clinic MD:  Carlis Abbott

## 2019-10-21 ENCOUNTER — Other Ambulatory Visit: Payer: Self-pay | Admitting: *Deleted

## 2019-10-21 DIAGNOSIS — I6523 Occlusion and stenosis of bilateral carotid arteries: Secondary | ICD-10-CM

## 2020-02-03 DIAGNOSIS — H401131 Primary open-angle glaucoma, bilateral, mild stage: Secondary | ICD-10-CM | POA: Diagnosis not present

## 2020-03-04 DIAGNOSIS — H401111 Primary open-angle glaucoma, right eye, mild stage: Secondary | ICD-10-CM | POA: Diagnosis not present

## 2020-04-13 DIAGNOSIS — R7989 Other specified abnormal findings of blood chemistry: Secondary | ICD-10-CM | POA: Diagnosis not present

## 2020-04-13 DIAGNOSIS — E7800 Pure hypercholesterolemia, unspecified: Secondary | ICD-10-CM | POA: Diagnosis not present

## 2020-04-13 DIAGNOSIS — I25810 Atherosclerosis of coronary artery bypass graft(s) without angina pectoris: Secondary | ICD-10-CM | POA: Diagnosis not present

## 2020-04-13 DIAGNOSIS — I10 Essential (primary) hypertension: Secondary | ICD-10-CM | POA: Diagnosis not present

## 2020-04-13 DIAGNOSIS — H409 Unspecified glaucoma: Secondary | ICD-10-CM | POA: Diagnosis not present

## 2020-04-20 DIAGNOSIS — Z85828 Personal history of other malignant neoplasm of skin: Secondary | ICD-10-CM | POA: Diagnosis not present

## 2020-04-20 DIAGNOSIS — Z79899 Other long term (current) drug therapy: Secondary | ICD-10-CM | POA: Diagnosis not present

## 2020-04-20 DIAGNOSIS — I2510 Atherosclerotic heart disease of native coronary artery without angina pectoris: Secondary | ICD-10-CM | POA: Diagnosis not present

## 2020-04-20 DIAGNOSIS — I10 Essential (primary) hypertension: Secondary | ICD-10-CM | POA: Diagnosis not present

## 2020-04-27 DIAGNOSIS — Z85828 Personal history of other malignant neoplasm of skin: Secondary | ICD-10-CM | POA: Diagnosis not present

## 2020-04-27 DIAGNOSIS — Z872 Personal history of diseases of the skin and subcutaneous tissue: Secondary | ICD-10-CM | POA: Diagnosis not present

## 2020-04-27 DIAGNOSIS — Z08 Encounter for follow-up examination after completed treatment for malignant neoplasm: Secondary | ICD-10-CM | POA: Diagnosis not present

## 2020-04-27 DIAGNOSIS — L570 Actinic keratosis: Secondary | ICD-10-CM | POA: Diagnosis not present

## 2020-04-27 DIAGNOSIS — D485 Neoplasm of uncertain behavior of skin: Secondary | ICD-10-CM | POA: Diagnosis not present

## 2020-04-27 DIAGNOSIS — D0471 Carcinoma in situ of skin of right lower limb, including hip: Secondary | ICD-10-CM | POA: Diagnosis not present

## 2020-07-21 DIAGNOSIS — D0471 Carcinoma in situ of skin of right lower limb, including hip: Secondary | ICD-10-CM | POA: Diagnosis not present

## 2020-10-14 DIAGNOSIS — H401131 Primary open-angle glaucoma, bilateral, mild stage: Secondary | ICD-10-CM | POA: Diagnosis not present

## 2020-10-19 DIAGNOSIS — D696 Thrombocytopenia, unspecified: Secondary | ICD-10-CM | POA: Diagnosis not present

## 2020-10-19 DIAGNOSIS — C4402 Squamous cell carcinoma of skin of lip: Secondary | ICD-10-CM | POA: Diagnosis not present

## 2020-10-19 DIAGNOSIS — I10 Essential (primary) hypertension: Secondary | ICD-10-CM | POA: Diagnosis not present

## 2020-10-19 DIAGNOSIS — C4499 Other specified malignant neoplasm of skin, unspecified: Secondary | ICD-10-CM | POA: Diagnosis not present

## 2020-10-19 DIAGNOSIS — R7989 Other specified abnormal findings of blood chemistry: Secondary | ICD-10-CM | POA: Diagnosis not present

## 2020-10-19 DIAGNOSIS — I25810 Atherosclerosis of coronary artery bypass graft(s) without angina pectoris: Secondary | ICD-10-CM | POA: Diagnosis not present

## 2020-10-26 DIAGNOSIS — Z9889 Other specified postprocedural states: Secondary | ICD-10-CM | POA: Insufficient documentation

## 2020-10-26 DIAGNOSIS — I25810 Atherosclerosis of coronary artery bypass graft(s) without angina pectoris: Secondary | ICD-10-CM | POA: Diagnosis not present

## 2020-10-26 DIAGNOSIS — I10 Essential (primary) hypertension: Secondary | ICD-10-CM | POA: Diagnosis not present

## 2020-10-26 DIAGNOSIS — Z0000 Encounter for general adult medical examination without abnormal findings: Secondary | ICD-10-CM | POA: Diagnosis not present

## 2020-10-26 DIAGNOSIS — E785 Hyperlipidemia, unspecified: Secondary | ICD-10-CM | POA: Diagnosis not present

## 2020-10-26 DIAGNOSIS — I6529 Occlusion and stenosis of unspecified carotid artery: Secondary | ICD-10-CM | POA: Diagnosis not present

## 2020-10-26 DIAGNOSIS — H409 Unspecified glaucoma: Secondary | ICD-10-CM | POA: Diagnosis not present

## 2020-11-08 ENCOUNTER — Other Ambulatory Visit: Payer: Self-pay

## 2020-11-08 ENCOUNTER — Ambulatory Visit (HOSPITAL_COMMUNITY)
Admission: RE | Admit: 2020-11-08 | Discharge: 2020-11-08 | Disposition: A | Discharge status: Home / Self Care | Payer: Medicare HMO | Source: Ambulatory Visit | Attending: Vascular Surgery | Admitting: Vascular Surgery

## 2020-11-08 ENCOUNTER — Ambulatory Visit: Payer: Medicare HMO | Admitting: Physician Assistant

## 2020-11-08 VITALS — BP 164/83 | HR 63 | Temp 97.9°F | Resp 20 | Ht 66.0 in | Wt 109.8 lb

## 2020-11-08 DIAGNOSIS — I6523 Occlusion and stenosis of bilateral carotid arteries: Secondary | ICD-10-CM

## 2020-11-08 NOTE — Progress Notes (Signed)
Carotid Artery Follow-Up   VASCULAR SURGERY ASSESSMENT & PLAN:   Brenda Russell is a 80 y.o. female who is status post right carotid endarterectomy by Dr. Donnetta Hutching in 2005.  She presents for annual follow-up. Bilateral carotid artery stenosis: The patient has no symptoms referable to carotid artery stenosis.  Duplex examination today is stable as compared to 1 year ago.  We reviewed the signs and symptoms of stroke/TIA and advised the patient to call EMS should these occur.   Continue optimal medical management of hypertension and follow-up with primary care physician. Does not use tobacco products.   Continue the following medications: Aspirin and statin Follow-up in 1 year with carotid duplex ultrasound.  SUBJECTIVE:   The patient denies monocular blindness, slurred speech, facial drooping, extremity weakness or numbness.  She denies claudication or rest pain  PHYSICAL EXAM:   Vitals:   11/08/20 1100 11/08/20 1101  BP: (!) 147/83 (!) 164/83  Pulse: 63   Resp: 20   Temp: 97.9 F (36.6 C)   TempSrc: Temporal   SpO2: 93%   Weight: 109 lb 12.8 oz (49.8 kg)   Height: 5\' 6"  (1.676 m)     General appearance: Well-developed, well-nourished in no apparent distress Neurologic: Alert and oriented x4, tongue is midline, face symmetric, speech fluent, 5 out of 5 bilateral upper extremity grip strength, triceps and biceps strength Cardiovascular: Heart rate and rhythm are regular.  Pedal pulses are palpable.  No carotid bruits. Respirations: Nonlabored Abdomen: No palpable pulsatile mass   NON-INVASIVE VASCULAR STUDIES  11/08/2020 Right Carotid: Velocities in the right ICA are consistent with a 1-39% stenosis.   Left Carotid: Velocities in the left ICA are consistent with a 1-39%  stenosis. The ECA appears >50% stenosed.   Bilateral vertebral arteries are antegrade Bilateral subclavian arteries with multiphasic waveforms.  PROBLEM LIST:    The patient's past medical history, past  surgical history, family history, social history, allergy list and medication list are reviewed.   CURRENT MEDS:    Current Outpatient Medications:  .  amLODipine (NORVASC) 2.5 MG tablet, Take 2.5 mg by mouth daily., Disp: , Rfl:  .  aspirin 325 MG EC tablet, Take 325 mg by mouth daily., Disp: , Rfl:  .  Cetirizine HCl (ZYRTEC PO), Take 1 tablet by mouth as needed. Reported on 11/30/2015, Disp: , Rfl:  .  metoprolol tartrate (LOPRESSOR) 25 MG tablet, Take 25 mg by mouth daily. Reported on 11/30/2015, Disp: , Rfl:  .  Probiotic Product (ALIGN PO), Take by mouth as needed. , Disp: , Rfl:  .  rosuvastatin (CRESTOR) 5 MG tablet, Take 5 mg by mouth daily., Disp: , Rfl:  .  timolol (BETIMOL) 0.25 % ophthalmic solution, Place 1-2 drops into the right eye 2 (two) times daily., Disp: , Rfl:  .  Vitamin D, Ergocalciferol, (DRISDOL) 50000 UNITS CAPS capsule, Take 50,000 Units by mouth every 7 (seven) days., Disp: , Rfl:  .  dorzolamide-timolol (COSOPT) 22.3-6.8 MG/ML ophthalmic solution, , Disp: , Rfl:    REVIEW OF SYSTEMS:   [X]  denotes positive finding, [ ]  denotes negative finding Cardiac  Comments:  Chest pain or chest pressure:    Shortness of breath upon exertion:    Short of breath when lying flat:    Irregular heart rhythm:        Vascular    Pain in calf, thigh, or hip brought on by ambulation:    Pain in feet at night that wakes you up from your  sleep:     Blood clot in your veins:    Leg swelling:         Pulmonary    Oxygen at home:    Productive cough:     Wheezing:         Neurologic    Sudden weakness in arms or legs:     Sudden numbness in arms or legs:     Sudden onset of difficulty speaking or slurred speech:    Temporary loss of vision in one eye:     Problems with dizziness:         Gastrointestinal    Blood in stool:     Vomited blood:         Genitourinary    Burning when urinating:     Blood in urine:        Psychiatric    Major depression:          Hematologic    Bleeding problems:    Problems with blood clotting too easily:        Skin    Rashes or ulcers:        Constitutional    Fever or chills:     Barbie Banner, PA-C  Office: [phone] 11/08/2020

## 2021-01-19 DIAGNOSIS — Z85828 Personal history of other malignant neoplasm of skin: Secondary | ICD-10-CM | POA: Diagnosis not present

## 2021-01-19 DIAGNOSIS — D2272 Melanocytic nevi of left lower limb, including hip: Secondary | ICD-10-CM | POA: Diagnosis not present

## 2021-01-19 DIAGNOSIS — L821 Other seborrheic keratosis: Secondary | ICD-10-CM | POA: Diagnosis not present

## 2021-01-19 DIAGNOSIS — X32XXXA Exposure to sunlight, initial encounter: Secondary | ICD-10-CM | POA: Diagnosis not present

## 2021-01-19 DIAGNOSIS — D2261 Melanocytic nevi of right upper limb, including shoulder: Secondary | ICD-10-CM | POA: Diagnosis not present

## 2021-01-19 DIAGNOSIS — L570 Actinic keratosis: Secondary | ICD-10-CM | POA: Diagnosis not present

## 2021-01-19 DIAGNOSIS — Z872 Personal history of diseases of the skin and subcutaneous tissue: Secondary | ICD-10-CM | POA: Diagnosis not present

## 2021-01-19 DIAGNOSIS — D225 Melanocytic nevi of trunk: Secondary | ICD-10-CM | POA: Diagnosis not present

## 2021-04-14 DIAGNOSIS — H40002 Preglaucoma, unspecified, left eye: Secondary | ICD-10-CM | POA: Diagnosis not present

## 2021-04-20 DIAGNOSIS — Z9889 Other specified postprocedural states: Secondary | ICD-10-CM | POA: Diagnosis not present

## 2021-04-20 DIAGNOSIS — H409 Unspecified glaucoma: Secondary | ICD-10-CM | POA: Diagnosis not present

## 2021-04-20 DIAGNOSIS — R739 Hyperglycemia, unspecified: Secondary | ICD-10-CM | POA: Diagnosis not present

## 2021-04-20 DIAGNOSIS — I10 Essential (primary) hypertension: Secondary | ICD-10-CM | POA: Diagnosis not present

## 2021-04-20 DIAGNOSIS — I25810 Atherosclerosis of coronary artery bypass graft(s) without angina pectoris: Secondary | ICD-10-CM | POA: Diagnosis not present

## 2021-04-20 DIAGNOSIS — I6523 Occlusion and stenosis of bilateral carotid arteries: Secondary | ICD-10-CM | POA: Diagnosis not present

## 2021-04-20 DIAGNOSIS — E7800 Pure hypercholesterolemia, unspecified: Secondary | ICD-10-CM | POA: Diagnosis not present

## 2021-04-21 DIAGNOSIS — H401111 Primary open-angle glaucoma, right eye, mild stage: Secondary | ICD-10-CM | POA: Diagnosis not present

## 2021-04-27 DIAGNOSIS — I10 Essential (primary) hypertension: Secondary | ICD-10-CM | POA: Diagnosis not present

## 2021-04-27 DIAGNOSIS — Z8679 Personal history of other diseases of the circulatory system: Secondary | ICD-10-CM | POA: Diagnosis not present

## 2021-04-27 DIAGNOSIS — Z23 Encounter for immunization: Secondary | ICD-10-CM | POA: Diagnosis not present

## 2021-04-27 DIAGNOSIS — C44320 Squamous cell carcinoma of skin of unspecified parts of face: Secondary | ICD-10-CM | POA: Diagnosis not present

## 2021-04-27 DIAGNOSIS — R634 Abnormal weight loss: Secondary | ICD-10-CM | POA: Diagnosis not present

## 2021-04-27 DIAGNOSIS — R7309 Other abnormal glucose: Secondary | ICD-10-CM | POA: Diagnosis not present

## 2021-10-20 DIAGNOSIS — H40002 Preglaucoma, unspecified, left eye: Secondary | ICD-10-CM | POA: Diagnosis not present

## 2021-10-25 DIAGNOSIS — C4402 Squamous cell carcinoma of skin of lip: Secondary | ICD-10-CM | POA: Diagnosis not present

## 2021-10-25 DIAGNOSIS — I6523 Occlusion and stenosis of bilateral carotid arteries: Secondary | ICD-10-CM | POA: Diagnosis not present

## 2021-10-25 DIAGNOSIS — R634 Abnormal weight loss: Secondary | ICD-10-CM | POA: Diagnosis not present

## 2021-10-25 DIAGNOSIS — R7309 Other abnormal glucose: Secondary | ICD-10-CM | POA: Diagnosis not present

## 2021-10-25 DIAGNOSIS — I10 Essential (primary) hypertension: Secondary | ICD-10-CM | POA: Diagnosis not present

## 2021-11-01 ENCOUNTER — Other Ambulatory Visit: Payer: Self-pay

## 2021-11-01 DIAGNOSIS — I2510 Atherosclerotic heart disease of native coronary artery without angina pectoris: Secondary | ICD-10-CM | POA: Diagnosis not present

## 2021-11-01 DIAGNOSIS — Z0000 Encounter for general adult medical examination without abnormal findings: Secondary | ICD-10-CM | POA: Diagnosis not present

## 2021-11-01 DIAGNOSIS — I10 Essential (primary) hypertension: Secondary | ICD-10-CM | POA: Diagnosis not present

## 2021-11-01 DIAGNOSIS — Z1389 Encounter for screening for other disorder: Secondary | ICD-10-CM | POA: Diagnosis not present

## 2021-11-01 DIAGNOSIS — E785 Hyperlipidemia, unspecified: Secondary | ICD-10-CM | POA: Diagnosis not present

## 2021-11-01 DIAGNOSIS — I6523 Occlusion and stenosis of bilateral carotid arteries: Secondary | ICD-10-CM

## 2021-11-01 DIAGNOSIS — Z9889 Other specified postprocedural states: Secondary | ICD-10-CM | POA: Diagnosis not present

## 2021-11-14 NOTE — Progress Notes (Signed)
? ? ? ?History of Present Illness:  Patient is a 81 y.o. year old female who presents for evaluation of asymptomatic carotid stenosis.  She has a history of right CEA by Dr. Donnetta Hutching in 2005.   ? ?The patient denies symptoms of TIA, amaurosis, or stroke.  She stays active daily and eats healthy.  She is medically managed on ASA and Crestor daily. ? ?Past Medical History:  ?Diagnosis Date  ? Cancer [facility])   ? basal and squamous cell skin  ? Carotid artery occlusion   ? Coronary artery disease   ? Glaucoma   ? Hyperlipidemia   ? Hypertension   ? ? ?Past Surgical History:  ?Procedure Laterality Date  ? CAROTID ENDARTERECTOMY  2005  ? CATARACT EXTRACTION, BILATERAL    ? CORONARY ARTERY BYPASS GRAFT  2005  ? EYE SURGERY    ? cataract  ? SKIN CANCER EXCISION    ? ? ? ?Social History ?Social History  ? ?Tobacco Use  ? Smoking status: Never  ?  Passive exposure: Past (Entire Family)  ? Smokeless tobacco: Never  ?Substance Use Topics  ? Alcohol use: Yes  ?  Alcohol/week: 2.0 standard drinks  ?  Types: 1 Glasses of wine, 1 Cans of beer per week  ? Drug use: No  ? ? ?Family History ?Family History  ?Problem Relation Age of Onset  ? Other Other   ?     premature atherosclerotic disease  ? Hypertension Father   ? Heart disease Father   ? Heart disease Mother   ? Hypertension Mother   ? Arthritis Sister   ? ? ?Allergies ? ?Allergies  ?Allergen Reactions  ? Penicillins Rash  ?  Other reaction(s): Unknown  ? ? ? ?Current Outpatient Medications  ?Medication Sig Dispense Refill  ? amLODipine (NORVASC) 2.5 MG tablet Take 2.5 mg by mouth daily.    ? aspirin 325 MG EC tablet Take 325 mg by mouth daily.    ? Cetirizine HCl (ZYRTEC PO) Take 1 tablet by mouth as needed. Reported on 11/30/2015    ? dorzolamide-timolol (COSOPT) 22.3-6.8 MG/ML ophthalmic solution     ? metoprolol tartrate (LOPRESSOR) 25 MG tablet Take 25 mg by mouth daily. Reported on 11/30/2015    ? Probiotic Product (ALIGN PO) Take by mouth as needed.     ? rosuvastatin  (CRESTOR) 5 MG tablet Take 5 mg by mouth daily.    ? timolol (BETIMOL) 0.25 % ophthalmic solution Place 1-2 drops into the right eye 2 (two) times daily.    ? Vitamin D, Ergocalciferol, (DRISDOL) 50000 UNITS CAPS capsule Take 50,000 Units by mouth every 7 (seven) days.    ? ?No current facility-administered medications for this visit.  ? ? ?ROS:  ? ?General:  No weight loss, Fever, chills ? ?HEENT: No recent headaches, no nasal bleeding, no visual changes, no sore throat ? ?Neurologic: No dizziness, blackouts, seizures. No recent symptoms of stroke or mini- stroke. No recent episodes of slurred speech, or temporary blindness. ? ?Cardiac: No recent episodes of chest pain/pressure, no shortness of breath at rest.  No shortness of breath with exertion.  Denies history of atrial fibrillation or irregular heartbeat ? ?Vascular: No history of rest pain in feet.  No history of claudication.  No history of non-healing ulcer, No history of DVT  ? ?Pulmonary: No home oxygen, no productive cough, no hemoptysis,  No asthma or wheezing ? ?Musculoskeletal:  '[ ]'$  Arthritis, '[ ]'$  Low back pain,  [x ]  Joint pain ? ?Hematologic:No history of hypercoagulable state.  No history of easy bleeding.  No history of anemia ? ?Gastrointestinal: No hematochezia or melena,  No gastroesophageal reflux, no trouble swallowing ? ?Urinary: '[ ]'$  chronic Kidney disease, '[ ]'$  on HD - '[ ]'$  MWF or '[ ]'$  TTHS, '[ ]'$  Burning with urination, '[ ]'$  Frequent urination, '[ ]'$  Difficulty urinating;  ? ?Skin: No rashes ? ?Psychological: No history of anxiety,  No history of depression ? ? ?Physical Examination ? ?Vitals:  ? 11/15/21 1316 11/15/21 1320  ?BP: (!) 193/90 (!) 159/77  ?Pulse: 83   ?Resp: 20   ?Temp: (!) 97.2 ?F (36.2 ?C)   ?TempSrc: Temporal   ?SpO2: 97%   ?Weight: 109 lb 11.2 oz (49.8 kg)   ? ? ?Body mass index is 17.71 kg/m?. ? ?General:  Alert and oriented, no acute distress ?HEENT: Normal ?Neck: No bruit or JVD ?Pulmonary: Clear to auscultation  bilaterally ?Cardiac: Regular Rate and Rhythm without murmur ?Gastrointestinal: Soft, non-tender, non-distended, no mass, no scars ?Skin: No rash ?Extremity Pulses:  2+ radial, brachial, femoral, dorsalis pedis, pulses bilaterally ?Musculoskeletal: No deformity or edema  ?Neurologic: Upper and lower extremity motor 5/5 and symmetric ? ?DATA:  ?   ?Right Carotid Findings:  ?+----------+--------+--------+--------+------------------+--------+  ?          PSV cm/sEDV cm/sStenosisPlaque DescriptionComments  ?+----------+--------+--------+--------+------------------+--------+  ?CCA Prox  68      8                                           ?+----------+--------+--------+--------+------------------+--------+  ?CCA Mid   76      9                                           ?+----------+--------+--------+--------+------------------+--------+  ?CCA Distal70      11              heterogenous                ?+----------+--------+--------+--------+------------------+--------+  ?ICA Prox  83      19      1-39%   heterogenous                ?+----------+--------+--------+--------+------------------+--------+  ?ICA Mid   76      17                                          ?+----------+--------+--------+--------+------------------+--------+  ?ICA Distal116     27                                          ?+----------+--------+--------+--------+------------------+--------+  ?ECA       100     10                                          ?+----------+--------+--------+--------+------------------+--------+  ? ?+----------+--------+-------+----------------+-------------------+  ?          PSV cm/sEDV cmsDescribe  Arm Pressure (mmHG)  ?+----------+--------+-------+----------------+-------------------+  ?Hamilton, WNL                     ?+----------+--------+-------+----------------+-------------------+   ? ?+---------+--------+--+--------+-+---------+  ?VertebralPSV cm/s48EDV cm/s8Antegrade  ?+---------+--------+--+--------+-+---------+  ? ?   ? ?Left Carotid Findings:  ?+----------+--------+--------+--------+------------------+--------+  ?          PSV cm/sEDV cm/sStenosisPlaque DescriptionComments  ?+----------+--------+--------+--------+------------------+--------+  ?CCA Prox  81      14                                          ?+----------+--------+--------+--------+------------------+--------+  ?CCA Mid   183     40      >50%    heterogenous                ?+----------+--------+--------+--------+------------------+--------+  ?CCA [id number]     19              heterogenous                ?+----------+--------+--------+--------+------------------+--------+  ?ICA Prox  84      16      1-39%   heterogenous                ?+----------+--------+--------+--------+------------------+--------+  ?ICA Mid   91      20                                          ?+----------+--------+--------+--------+------------------+--------+  ?ICA Distal93      23                                          ?+----------+--------+--------+--------+------------------+--------+  ?ECA       224     16      >50%    heterogenous                ?+----------+--------+--------+--------+------------------+--------+  ? ?+----------+--------+--------+----------------+-------------------+  ?          PSV cm/sEDV cm/sDescribe        Arm Pressure (mmHG)  ?+----------+--------+--------+----------------+-------------------+  ?Subclavian109             Multiphasic, WNL                     ?+----------+--------+--------+----------------+-------------------+  ? ?+---------+--------+--+--------+--+---------+  ?VertebralPSV cm/s46EDV cm/s11Antegrade  ?+---------+--------+--+--------+--+---------+  ? ?   ? ?   ? ?Summary:  ?Right Carotid: Velocities in the right ICA are consistent  with a 1-39%  ?stenosis.  ? ?Left Carotid: Velocities in the left ICA are consistent with a 1-39%  ?stenosis.  ?              Hemodynamically significant plaque >50% visualized in the  ?CCA. The  ?              ECA appears >50% stenosed.  ? ?Vertebrals:  Bilateral vertebral arteries demonstrate antegrade flow.  ?Subclavians: Normal flow hemodynamics were seen in bilateral subclavian  ?             arteri

## 2021-11-15 ENCOUNTER — Ambulatory Visit: Payer: Medicare HMO | Admitting: Physician Assistant

## 2021-11-15 ENCOUNTER — Ambulatory Visit (HOSPITAL_COMMUNITY)
Admission: RE | Admit: 2021-11-15 | Discharge: 2021-11-15 | Disposition: A | Discharge status: Home / Self Care | Payer: Medicare HMO | Source: Ambulatory Visit | Attending: Vascular Surgery | Admitting: Vascular Surgery

## 2021-11-15 ENCOUNTER — Encounter: Payer: Self-pay | Admitting: Physician Assistant

## 2021-11-15 VITALS — BP 159/77 | HR 83 | Temp 97.2°F | Resp 20 | Wt 109.7 lb

## 2021-11-15 DIAGNOSIS — I6523 Occlusion and stenosis of bilateral carotid arteries: Secondary | ICD-10-CM | POA: Insufficient documentation

## 2022-01-24 DIAGNOSIS — Z08 Encounter for follow-up examination after completed treatment for malignant neoplasm: Secondary | ICD-10-CM | POA: Diagnosis not present

## 2022-01-24 DIAGNOSIS — D485 Neoplasm of uncertain behavior of skin: Secondary | ICD-10-CM | POA: Diagnosis not present

## 2022-01-24 DIAGNOSIS — X32XXXA Exposure to sunlight, initial encounter: Secondary | ICD-10-CM | POA: Diagnosis not present

## 2022-01-24 DIAGNOSIS — L570 Actinic keratosis: Secondary | ICD-10-CM | POA: Diagnosis not present

## 2022-01-24 DIAGNOSIS — Z85828 Personal history of other malignant neoplasm of skin: Secondary | ICD-10-CM | POA: Diagnosis not present

## 2022-01-24 DIAGNOSIS — Z872 Personal history of diseases of the skin and subcutaneous tissue: Secondary | ICD-10-CM | POA: Diagnosis not present

## 2022-03-29 DIAGNOSIS — X32XXXA Exposure to sunlight, initial encounter: Secondary | ICD-10-CM | POA: Diagnosis not present

## 2022-03-29 DIAGNOSIS — L570 Actinic keratosis: Secondary | ICD-10-CM | POA: Diagnosis not present

## 2022-04-24 DIAGNOSIS — H401111 Primary open-angle glaucoma, right eye, mild stage: Secondary | ICD-10-CM | POA: Diagnosis not present

## 2022-04-26 DIAGNOSIS — R7309 Other abnormal glucose: Secondary | ICD-10-CM | POA: Diagnosis not present

## 2022-04-26 DIAGNOSIS — E7800 Pure hypercholesterolemia, unspecified: Secondary | ICD-10-CM | POA: Diagnosis not present

## 2022-04-26 DIAGNOSIS — I2510 Atherosclerotic heart disease of native coronary artery without angina pectoris: Secondary | ICD-10-CM | POA: Diagnosis not present

## 2022-04-26 DIAGNOSIS — I10 Essential (primary) hypertension: Secondary | ICD-10-CM | POA: Diagnosis not present

## 2022-04-26 DIAGNOSIS — Z9889 Other specified postprocedural states: Secondary | ICD-10-CM | POA: Diagnosis not present

## 2022-05-03 DIAGNOSIS — Z85828 Personal history of other malignant neoplasm of skin: Secondary | ICD-10-CM | POA: Diagnosis not present

## 2022-05-03 DIAGNOSIS — I2510 Atherosclerotic heart disease of native coronary artery without angina pectoris: Secondary | ICD-10-CM | POA: Diagnosis not present

## 2022-05-03 DIAGNOSIS — I10 Essential (primary) hypertension: Secondary | ICD-10-CM | POA: Diagnosis not present

## 2022-05-03 DIAGNOSIS — R7989 Other specified abnormal findings of blood chemistry: Secondary | ICD-10-CM | POA: Diagnosis not present

## 2022-05-03 DIAGNOSIS — H4089 Other specified glaucoma: Secondary | ICD-10-CM | POA: Diagnosis not present

## 2022-11-14 DIAGNOSIS — R7309 Other abnormal glucose: Secondary | ICD-10-CM | POA: Diagnosis not present

## 2022-11-14 DIAGNOSIS — I10 Essential (primary) hypertension: Secondary | ICD-10-CM | POA: Diagnosis not present

## 2022-11-14 DIAGNOSIS — R8290 Unspecified abnormal findings in urine: Secondary | ICD-10-CM | POA: Diagnosis not present

## 2022-11-14 DIAGNOSIS — R7989 Other specified abnormal findings of blood chemistry: Secondary | ICD-10-CM | POA: Diagnosis not present

## 2022-11-14 DIAGNOSIS — I2510 Atherosclerotic heart disease of native coronary artery without angina pectoris: Secondary | ICD-10-CM | POA: Diagnosis not present

## 2022-11-14 DIAGNOSIS — C4402 Squamous cell carcinoma of skin of lip: Secondary | ICD-10-CM | POA: Diagnosis not present

## 2022-11-14 DIAGNOSIS — H4089 Other specified glaucoma: Secondary | ICD-10-CM | POA: Diagnosis not present

## 2022-11-21 DIAGNOSIS — Z1331 Encounter for screening for depression: Secondary | ICD-10-CM | POA: Diagnosis not present

## 2022-11-21 DIAGNOSIS — Z0000 Encounter for general adult medical examination without abnormal findings: Secondary | ICD-10-CM | POA: Diagnosis not present

## 2022-11-21 DIAGNOSIS — I10 Essential (primary) hypertension: Secondary | ICD-10-CM | POA: Diagnosis not present

## 2022-11-21 DIAGNOSIS — Z9889 Other specified postprocedural states: Secondary | ICD-10-CM | POA: Diagnosis not present

## 2022-11-21 DIAGNOSIS — R7989 Other specified abnormal findings of blood chemistry: Secondary | ICD-10-CM | POA: Diagnosis not present

## 2022-11-21 DIAGNOSIS — I2510 Atherosclerotic heart disease of native coronary artery without angina pectoris: Secondary | ICD-10-CM | POA: Diagnosis not present

## 2022-12-11 DIAGNOSIS — H401111 Primary open-angle glaucoma, right eye, mild stage: Secondary | ICD-10-CM | POA: Diagnosis not present

## 2023-02-12 DIAGNOSIS — I10 Essential (primary) hypertension: Secondary | ICD-10-CM | POA: Diagnosis not present

## 2023-02-12 DIAGNOSIS — Z9889 Other specified postprocedural states: Secondary | ICD-10-CM | POA: Diagnosis not present

## 2023-02-12 DIAGNOSIS — B029 Zoster without complications: Secondary | ICD-10-CM | POA: Diagnosis not present

## 2023-05-22 DIAGNOSIS — I10 Essential (primary) hypertension: Secondary | ICD-10-CM | POA: Diagnosis not present

## 2023-05-22 DIAGNOSIS — E785 Hyperlipidemia, unspecified: Secondary | ICD-10-CM | POA: Diagnosis not present

## 2023-05-22 DIAGNOSIS — R7309 Other abnormal glucose: Secondary | ICD-10-CM | POA: Diagnosis not present

## 2023-05-22 DIAGNOSIS — R8290 Unspecified abnormal findings in urine: Secondary | ICD-10-CM | POA: Diagnosis not present

## 2023-05-22 DIAGNOSIS — Z9889 Other specified postprocedural states: Secondary | ICD-10-CM | POA: Diagnosis not present

## 2023-05-22 DIAGNOSIS — R7989 Other specified abnormal findings of blood chemistry: Secondary | ICD-10-CM | POA: Diagnosis not present

## 2023-05-22 DIAGNOSIS — I2510 Atherosclerotic heart disease of native coronary artery without angina pectoris: Secondary | ICD-10-CM | POA: Diagnosis not present

## 2023-07-12 DIAGNOSIS — H401111 Primary open-angle glaucoma, right eye, mild stage: Secondary | ICD-10-CM | POA: Diagnosis not present

## 2023-07-18 DIAGNOSIS — H40002 Preglaucoma, unspecified, left eye: Secondary | ICD-10-CM | POA: Diagnosis not present

## 2023-07-18 DIAGNOSIS — H401111 Primary open-angle glaucoma, right eye, mild stage: Secondary | ICD-10-CM | POA: Diagnosis not present

## 2023-08-09 ENCOUNTER — Other Ambulatory Visit (HOSPITAL_COMMUNITY): Payer: Self-pay

## 2023-08-10 ENCOUNTER — Other Ambulatory Visit (HOSPITAL_COMMUNITY): Payer: Self-pay

## 2023-08-10 MED ORDER — ROSUVASTATIN CALCIUM 5 MG PO TABS
5.0000 mg | ORAL_TABLET | Freq: Every day | ORAL | 0 refills | Status: DC
  Filled 2023-08-10 – 2024-01-09 (×2): qty 90, 90d supply, fill #0

## 2023-08-10 MED ORDER — ERGOCALCIFEROL 1.25 MG (50000 UT) PO CAPS
50000.0000 [IU] | ORAL_CAPSULE | ORAL | 2 refills | Status: AC
Start: 2023-07-09 — End: ?
  Filled 2023-08-10 – 2024-01-09 (×2): qty 6, 84d supply, fill #0

## 2023-08-10 MED ORDER — METOPROLOL SUCCINATE ER 25 MG PO TB24
25.0000 mg | ORAL_TABLET | Freq: Every day | ORAL | 0 refills | Status: AC
  Filled 2023-08-10 – 2024-01-09 (×2): qty 90, 90d supply, fill #0

## 2023-08-10 MED ORDER — AMLODIPINE BESYLATE 2.5 MG PO TABS
2.5000 mg | ORAL_TABLET | Freq: Every day | ORAL | 0 refills | Status: AC
  Filled 2023-08-10 – 2024-01-09 (×2): qty 90, 90d supply, fill #0

## 2023-08-10 MED ORDER — DORZOLAMIDE HCL-TIMOLOL MAL 2-0.5 % OP SOLN
1.0000 [drp] | Freq: Two times a day (BID) | OPHTHALMIC | 0 refills | Status: AC
  Filled 2023-08-10 – 2024-01-09 (×2): qty 10, 100d supply, fill #0

## 2023-08-12 ENCOUNTER — Other Ambulatory Visit: Payer: Self-pay

## 2023-08-14 ENCOUNTER — Other Ambulatory Visit: Payer: Self-pay

## 2023-08-19 ENCOUNTER — Other Ambulatory Visit: Payer: Self-pay

## 2023-11-18 ENCOUNTER — Other Ambulatory Visit: Payer: Self-pay

## 2023-11-18 DIAGNOSIS — I6523 Occlusion and stenosis of bilateral carotid arteries: Secondary | ICD-10-CM

## 2023-11-27 ENCOUNTER — Ambulatory Visit: Admitting: Physician Assistant

## 2023-11-27 ENCOUNTER — Ambulatory Visit: Payer: Medicare HMO

## 2023-11-27 ENCOUNTER — Ambulatory Visit (HOSPITAL_COMMUNITY)
Admission: RE | Admit: 2023-11-27 | Discharge: 2023-11-27 | Disposition: A | Discharge status: Home / Self Care | Source: Ambulatory Visit | Attending: Vascular Surgery | Admitting: Vascular Surgery

## 2023-11-27 ENCOUNTER — Encounter (HOSPITAL_COMMUNITY): Payer: Medicare HMO

## 2023-11-27 VITALS — BP 167/82 | HR 64 | Temp 98.2°F

## 2023-11-27 DIAGNOSIS — I6523 Occlusion and stenosis of bilateral carotid arteries: Secondary | ICD-10-CM | POA: Diagnosis not present

## 2023-11-27 DIAGNOSIS — Z780 Asymptomatic menopausal state: Secondary | ICD-10-CM | POA: Diagnosis not present

## 2023-11-27 DIAGNOSIS — I2510 Atherosclerotic heart disease of native coronary artery without angina pectoris: Secondary | ICD-10-CM | POA: Diagnosis not present

## 2023-11-27 DIAGNOSIS — Z0000 Encounter for general adult medical examination without abnormal findings: Secondary | ICD-10-CM | POA: Diagnosis not present

## 2023-11-27 DIAGNOSIS — R5381 Other malaise: Secondary | ICD-10-CM | POA: Diagnosis not present

## 2023-11-27 DIAGNOSIS — R7309 Other abnormal glucose: Secondary | ICD-10-CM | POA: Diagnosis not present

## 2023-11-27 DIAGNOSIS — R5383 Other fatigue: Secondary | ICD-10-CM | POA: Diagnosis not present

## 2023-11-27 DIAGNOSIS — Z13820 Encounter for screening for osteoporosis: Secondary | ICD-10-CM | POA: Diagnosis not present

## 2023-11-27 DIAGNOSIS — N1832 Chronic kidney disease, stage 3b: Secondary | ICD-10-CM | POA: Diagnosis not present

## 2023-11-27 DIAGNOSIS — Z23 Encounter for immunization: Secondary | ICD-10-CM | POA: Diagnosis not present

## 2023-11-27 DIAGNOSIS — R7989 Other specified abnormal findings of blood chemistry: Secondary | ICD-10-CM | POA: Diagnosis not present

## 2023-11-27 NOTE — Progress Notes (Signed)
 History of Present Illness:  Patient is a 83 y.o. year old female who presents for evaluation of carotid stenosis.  She has a history of right CEA by Dr. Shirley Douglas in 2005.  The patient denies symptoms of amaurosis, aphasia weakness on one side of the body, TIA, amaurosis, or stroke.    She is medically managed on ASA and Crestor  daily.       Past Medical History:  Diagnosis Date   Cancer (HCC)    basal and squamous cell skin   Carotid artery occlusion    Coronary artery disease    Glaucoma    Hyperlipidemia    Hypertension     Past Surgical History:  Procedure Laterality Date   CAROTID ENDARTERECTOMY  2005   CATARACT EXTRACTION, BILATERAL     CORONARY ARTERY BYPASS GRAFT  2005   EYE SURGERY     cataract   SKIN CANCER EXCISION       Social History Social History   Tobacco Use   Smoking status: Never    Passive exposure: Past (Entire Family)   Smokeless tobacco: Never  Substance Use Topics   Alcohol  use: Yes    Alcohol /week: 2.0 standard drinks of alcohol     Types: 1 Glasses of wine, 1 Cans of beer per week   Drug use: No    Family History Family History  Problem Relation Age of Onset   Other Other        premature atherosclerotic disease   Hypertension Father    Heart disease Father    Heart disease Mother    Hypertension Mother    Arthritis Sister     Allergies  Allergies  Allergen Reactions   Penicillins Rash    Other reaction(s): Unknown     Current Outpatient Medications  Medication Sig Dispense Refill   amLODipine  (NORVASC ) 2.5 MG tablet Take 2.5 mg by mouth daily.     amLODipine  (NORVASC ) 2.5 MG tablet Take 1 tablet (2.5 mg total) by mouth daily. 90 tablet 0   aspirin 325 MG EC tablet Take 325 mg by mouth daily.     Cetirizine HCl (ZYRTEC PO) Take 1 tablet by mouth as needed. Reported on 11/30/2015     dorzolamide -timolol  (COSOPT ) 2-0.5 % ophthalmic solution Place 1 drop into the right eye 2 (two) times daily. 10 mL 0    dorzolamide -timolol  (COSOPT ) 22.3-6.8 MG/ML ophthalmic solution      ergocalciferol  (VITAMIN D2) 1.25 MG (50000 UT) capsule Take 1 capsule (50,000 Units total) by mouth every 14 (fourteen) days. 6 capsule 2   metoprolol  succinate (TOPROL -XL) 25 MG 24 hr tablet Take 1 tablet (25 mg total) by mouth daily. 90 tablet 0   metoprolol  tartrate (LOPRESSOR ) 25 MG tablet Take 25 mg by mouth daily. Reported on 11/30/2015     Probiotic Product (ALIGN PO) Take by mouth as needed.      rosuvastatin  (CRESTOR ) 5 MG tablet Take 5 mg by mouth daily.     rosuvastatin  (CRESTOR ) 5 MG tablet Take 1 tablet (5 mg total) by mouth daily. 90 tablet 0   timolol  (BETIMOL ) 0.25 % ophthalmic solution Place 1-2 drops into the right eye 2 (two) times daily.     Vitamin D , Ergocalciferol , (DRISDOL ) 50000 UNITS CAPS capsule Take 50,000 Units by mouth every 7 (seven) days.     No current facility-administered medications for this visit.    ROS:   General:  No weight loss, Fever, chills  HEENT: No recent headaches,  no nasal bleeding, no visual changes, no sore throat  Neurologic: No dizziness, blackouts, seizures. No recent symptoms of stroke or mini- stroke. No recent episodes of slurred speech, or temporary blindness.  Cardiac: No recent episodes of chest pain/pressure, no shortness of breath at rest.  No shortness of breath with exertion.  Denies history of atrial fibrillation or irregular heartbeat  Vascular: No history of rest pain in feet.  No history of claudication.  No history of non-healing ulcer, No history of DVT   Pulmonary: No home oxygen, no productive cough, no hemoptysis,  No asthma or wheezing  Musculoskeletal:  [x ] Arthritis, [ x] Low back pain,  [ ]  Joint pain  Hematologic:No history of hypercoagulable state.  No history of easy bleeding.  No history of anemia  Gastrointestinal: No hematochezia or melena,  No gastroesophageal reflux, no trouble swallowing  Urinary: [ ]  chronic Kidney disease, [ ]  on  HD - [ ]  MWF or [ ]  TTHS, [ ]  Burning with urination, [ ]  Frequent urination, [ ]  Difficulty urinating;   Skin: No rashes  Psychological: No history of anxiety,  No history of depression   Physical Examination  Vitals:   11/27/23 1400 11/27/23 1405  BP: (!) 192/73 (!) 167/82  Pulse: 64   Temp: 98.2 F (36.8 C)   TempSrc: Temporal   SpO2: 97%     There is no height or weight on file to calculate BMI.  General:  Alert and oriented, no acute distress HEENT: Normal Neck: No bruit or JVD Pulmonary: Clear to auscultation bilaterally Cardiac: Regular Rate and Rhythm without murmur Gastrointestinal: Soft, non-tender, non-distended, no mass, no scars Skin: No rash Extremity Pulses:  2+ radial pulses bilaterally Musculoskeletal: No deformity or edema  Neurologic: Upper and lower extremity motor 5/5 and symmetric  DATA:     Right Carotid Findings:  +----------+--------+--------+--------+------------------+--------+           PSV cm/sEDV cm/sStenosisPlaque DescriptionComments  +----------+--------+--------+--------+------------------+--------+  CCA Prox  30      30                                          +----------+--------+--------+--------+------------------+--------+  CCA Distal65      11              heterogenous                +----------+--------+--------+--------+------------------+--------+  ICA Prox  53      8       1-39%   heterogenous                +----------+--------+--------+--------+------------------+--------+  ICA Mid   58      8                                           +----------+--------+--------+--------+------------------+--------+  ICA Distal97      21                                          +----------+--------+--------+--------+------------------+--------+  ECA      59      59              heterogenous                +----------+--------+--------+--------+------------------+--------+    +----------+--------+-------+----------------+-------------------+  PSV cm/sEDV cmsDescribe        Arm Pressure (mmHG)  +----------+--------+-------+----------------+-------------------+  Subclavian100           Multiphasic, [id number]                  +----------+--------+-------+----------------+-------------------+   +---------+--------+--+--------+-+---------+  VertebralPSV cm/s23EDV cm/s5Antegrade  +---------+--------+--+--------+-+---------+      Left Carotid Findings:  +----------+--------+--------+--------+------------------+--------+           PSV cm/sEDV cm/sStenosisPlaque DescriptionComments  +----------+--------+--------+--------+------------------+--------+  CCA Prox  54      8                                           +----------+--------+--------+--------+------------------+--------+  CCA Mid   140     18      >50%    heterogenous                +----------+--------+--------+--------+------------------+--------+  CCA Distal69      8               heterogenous                +----------+--------+--------+--------+------------------+--------+  ICA Prox  64      11      1-39%   heterogenous                +----------+--------+--------+--------+------------------+--------+  ICA Mid   82      15                                          +----------+--------+--------+--------+------------------+--------+  ICA Distal85      19                                          +----------+--------+--------+--------+------------------+--------+  ECA      80                                                  +----------+--------+--------+--------+------------------+--------+   +----------+--------+--------+----------------+-------------------+           PSV cm/sEDV cm/sDescribe        Arm Pressure (mmHG)  +----------+--------+--------+----------------+-------------------+  [id number]              Multiphasic, [id number]                  +----------+--------+--------+----------------+-------------------+   +---------+--------+--+--------+--+---------+  VertebralPSV cm/s52EDV cm/s12Antegrade  +---------+--------+--+--------+--+---------+         Summary:  Right Carotid: Velocities in the right ICA are consistent with a 1-39%  stenosis.   Left Carotid: Velocities in the left ICA are consistent with a 1-39%  stenosis.               Hemodynamically significant plaque >50% visualized in the  CCA.   Vertebrals: Bilateral vertebral arteries demonstrate antegrade flow.  Subclavians: Normal flow hemodynamics were seen in bilateral subclavian               arteries.   ASSESSMENT/PLAN: Carotid stenosis s/p right CEA by Dr. Shirley Douglas in 2005.   She denies symptoms of  stroke since her last visit 2 years ago.  She stays active and takes asa and Crestor  daily while maintaining a healthy weight.   Her carotid duplex demonstrates b ICA < 39% stenosis.  She will follow up in 2 years for repeat duplex.  If she has concerns for stroke like symptoms she will call 911.    Rocky Cipro PA-C [facility] Office: [phone]  MD on call [place]

## 2023-11-28 ENCOUNTER — Ambulatory Visit: Payer: Medicare HMO

## 2023-11-28 ENCOUNTER — Ambulatory Visit (HOSPITAL_COMMUNITY): Payer: Medicare HMO

## 2023-12-18 ENCOUNTER — Other Ambulatory Visit (HOSPITAL_COMMUNITY): Payer: Self-pay

## 2023-12-18 DIAGNOSIS — M810 Age-related osteoporosis without current pathological fracture: Secondary | ICD-10-CM | POA: Diagnosis not present

## 2023-12-20 ENCOUNTER — Other Ambulatory Visit: Payer: Self-pay

## 2024-01-09 ENCOUNTER — Other Ambulatory Visit: Payer: Self-pay

## 2024-01-09 ENCOUNTER — Other Ambulatory Visit (HOSPITAL_COMMUNITY): Payer: Self-pay

## 2024-04-16 ENCOUNTER — Other Ambulatory Visit (HOSPITAL_COMMUNITY): Payer: Self-pay

## 2024-04-16 MED ORDER — ROSUVASTATIN CALCIUM 5 MG PO TABS
5.0000 mg | ORAL_TABLET | Freq: Every day | ORAL | 0 refills | Status: AC
  Filled 2024-04-16 – 2024-04-28 (×2): qty 90, 90d supply, fill #0

## 2024-04-26 ENCOUNTER — Other Ambulatory Visit (HOSPITAL_COMMUNITY): Payer: Self-pay

## 2024-04-28 ENCOUNTER — Other Ambulatory Visit (HOSPITAL_COMMUNITY): Payer: Self-pay

## 2024-04-29 ENCOUNTER — Other Ambulatory Visit (HOSPITAL_COMMUNITY): Payer: Self-pay

## 2024-05-20 DIAGNOSIS — R7989 Other specified abnormal findings of blood chemistry: Secondary | ICD-10-CM | POA: Diagnosis not present

## 2024-05-20 DIAGNOSIS — I2510 Atherosclerotic heart disease of native coronary artery without angina pectoris: Secondary | ICD-10-CM | POA: Diagnosis not present

## 2024-05-20 DIAGNOSIS — R7309 Other abnormal glucose: Secondary | ICD-10-CM | POA: Diagnosis not present

## 2024-05-20 DIAGNOSIS — R5381 Other malaise: Secondary | ICD-10-CM | POA: Diagnosis not present

## 2024-05-20 DIAGNOSIS — Z0000 Encounter for general adult medical examination without abnormal findings: Secondary | ICD-10-CM | POA: Diagnosis not present

## 2024-05-20 DIAGNOSIS — R5383 Other fatigue: Secondary | ICD-10-CM | POA: Diagnosis not present

## 2024-05-20 DIAGNOSIS — N1832 Chronic kidney disease, stage 3b: Secondary | ICD-10-CM | POA: Diagnosis not present

## 2024-05-27 DIAGNOSIS — I2510 Atherosclerotic heart disease of native coronary artery without angina pectoris: Secondary | ICD-10-CM | POA: Diagnosis not present

## 2024-05-27 DIAGNOSIS — F331 Major depressive disorder, recurrent, moderate: Secondary | ICD-10-CM | POA: Diagnosis not present

## 2024-05-27 DIAGNOSIS — Z9889 Other specified postprocedural states: Secondary | ICD-10-CM | POA: Diagnosis not present

## 2024-05-27 DIAGNOSIS — E7800 Pure hypercholesterolemia, unspecified: Secondary | ICD-10-CM | POA: Diagnosis not present

## 2024-05-27 DIAGNOSIS — C4402 Squamous cell carcinoma of skin of lip: Secondary | ICD-10-CM | POA: Diagnosis not present

## 2024-05-27 DIAGNOSIS — R7989 Other specified abnormal findings of blood chemistry: Secondary | ICD-10-CM | POA: Diagnosis not present

## 2024-05-27 DIAGNOSIS — Z951 Presence of aortocoronary bypass graft: Secondary | ICD-10-CM | POA: Diagnosis not present

## 2024-05-27 DIAGNOSIS — I10 Essential (primary) hypertension: Secondary | ICD-10-CM | POA: Diagnosis not present

## 2024-06-09 DIAGNOSIS — Z961 Presence of intraocular lens: Secondary | ICD-10-CM | POA: Diagnosis not present

## 2024-06-09 DIAGNOSIS — H401111 Primary open-angle glaucoma, right eye, mild stage: Secondary | ICD-10-CM | POA: Diagnosis not present

## 2024-06-09 DIAGNOSIS — H40002 Preglaucoma, unspecified, left eye: Secondary | ICD-10-CM | POA: Diagnosis not present
